# Patient Record
Sex: Male | Born: 1950
Health system: Southern US, Community
[De-identification: ages and names within clinical notes are randomized; demographics above are authoritative.]

## PROBLEM LIST (undated history)

## (undated) DIAGNOSIS — E785 Hyperlipidemia, unspecified: Secondary | ICD-10-CM

## (undated) DIAGNOSIS — R42 Dizziness and giddiness: Secondary | ICD-10-CM

## (undated) DIAGNOSIS — I10 Essential (primary) hypertension: Secondary | ICD-10-CM

## (undated) DIAGNOSIS — K219 Gastro-esophageal reflux disease without esophagitis: Secondary | ICD-10-CM

## (undated) DIAGNOSIS — H8110 Benign paroxysmal vertigo, unspecified ear: Secondary | ICD-10-CM

## (undated) DIAGNOSIS — G454 Transient global amnesia: Secondary | ICD-10-CM

## (undated) HISTORY — DX: Benign paroxysmal vertigo, unspecified ear: H81.10

## (undated) HISTORY — DX: Gastro-esophageal reflux disease without esophagitis: K21.9

## (undated) HISTORY — PX: KNEE SURGERY: SHX244

## (undated) HISTORY — DX: Hyperlipidemia, unspecified: E78.5

## (undated) HISTORY — PX: OTHER SURGICAL HISTORY: SHX169

## (undated) HISTORY — DX: Transient global amnesia: G45.4

---

## 1998-11-25 ENCOUNTER — Ambulatory Visit (HOSPITAL_COMMUNITY): Admission: RE | Admit: 1998-11-25 | Discharge: 1998-11-25 | Payer: Self-pay | Admitting: Gastroenterology

## 2001-08-01 ENCOUNTER — Encounter (INDEPENDENT_AMBULATORY_CARE_PROVIDER_SITE_OTHER): Payer: Self-pay | Admitting: Specialist

## 2001-08-01 ENCOUNTER — Ambulatory Visit (HOSPITAL_COMMUNITY): Admission: RE | Admit: 2001-08-01 | Discharge: 2001-08-01 | Payer: Self-pay | Admitting: Gastroenterology

## 2006-03-13 ENCOUNTER — Encounter: Admission: RE | Admit: 2006-03-13 | Discharge: 2006-03-13 | Payer: Self-pay | Admitting: Family Medicine

## 2007-02-27 ENCOUNTER — Encounter: Admission: RE | Admit: 2007-02-27 | Discharge: 2007-02-27 | Payer: Self-pay | Admitting: Family Medicine

## 2007-03-17 ENCOUNTER — Encounter: Admission: RE | Admit: 2007-03-17 | Discharge: 2007-03-17 | Payer: Self-pay | Admitting: Family Medicine

## 2007-03-18 ENCOUNTER — Emergency Department (HOSPITAL_COMMUNITY): Admission: EM | Admit: 2007-03-18 | Discharge: 2007-03-18 | Payer: Self-pay | Admitting: Emergency Medicine

## 2007-04-15 ENCOUNTER — Encounter: Admission: RE | Admit: 2007-04-15 | Discharge: 2007-05-07 | Payer: Self-pay | Admitting: Family Medicine

## 2007-05-20 ENCOUNTER — Encounter: Admission: RE | Admit: 2007-05-20 | Discharge: 2007-05-29 | Payer: Self-pay | Admitting: Family Medicine

## 2010-04-25 ENCOUNTER — Encounter
Admission: RE | Admit: 2010-04-25 | Discharge: 2010-04-25 | Payer: Self-pay | Source: Home / Self Care | Attending: Endocrinology | Admitting: Endocrinology

## 2010-09-22 NOTE — Procedures (Signed)
[place]. [facility]  Patient:    Daniel Murphy, Daniel Murphy Visit Number: [id number] MRN: [id number]          Service Type: END Location: ENDO Attending Physician:  Nelda Marseille Dictated by:   Petra Kuba, M.D. Proc. Date: 08/01/01 Admit Date:  08/01/2001   CC:         Duffy Rhody C. Andrey Campanile, M.D.   Procedure Report  PROCEDURE:  Colonoscopy, biopsy, and polypectomy.  INDICATIONS FOR PROCEDURE:  Bright red blood per rectum, due for colonic screening.  CONSENT:  Consent was signed after risks, benefits, methods, and options were thoroughly discussed in the office.  MEDICATIONS USED: 1. Demerol 90 mg. 2. Versed 9 mg.  DESCRIPTION OF PROCEDURE:  Rectal inspection is pertinent for small external hemorrhoids.  Digital examination was negative.  Pediatric video adjustable colonoscope was inserted and fairly easily advanced around the colon to the cecum.  This did require rolling him on his back and some abdominal pressure. The cecum was identified by the appendiceal orifice and the ileocecal valve. On insertion, there was some tiny erosions periodically throughout the colon, but no other lesions seen.  The scope was inserted a short ways into the terminal ileum which was normal.  Scope was slowly withdrawn.  The prep was adequate.  There was some liquid stool that required washing and suctioning, but on slow withdrawal through the colon, adequate visualization was obtained. There were tiny erosions throughout the colon.  They looked more prep induced than any other etiology, and multiple scattered biopsies were obtained. However, on slow withdrawal through the colon, other than these abnormalities, no other diverticuli, polyps, masses, or other abnormalities were seen as we slowly withdrew back to the rectum.  Once back in the rectum, the scope was retroflexed, pertinent for some small internal hemorrhoids, and a small pedunculated inflammatory looking polyp  on retroflexion.  This seemed to be just outside the anal rectal junction, and just outside the hemorrhoidal line. We went ahead and probed it with the biopsy forceps, and it did confirm its pedunculated nature.  We evaluated both on stray and retroflex visualization, and thought it would be easier to snare on retroflexion which we did. Electrocautery applied, and the polyp was removed, suctioned through the scope, and collected in the trap.  There was a nice white coagulum seen without any obvious residual polyp.  This did have a slight inflammatory appearance to it.  The scope was straightened, readvanced a short ways up the sigmoid.  Air was suctioned, scope removed.  The patient tolerated the procedure well.  There was no obvious immediate complication.  ENDOSCOPIC DIAGNOSES: 1. Internal and external hemorrhoids. 2. Rectal distal polyp on retroflexion, status post snare, possibly    inflammatory. 3. Tiny erosions throughout the colon scattered, probably prep induced status    post biopsy. 4. Otherwise within normal limits to the terminal ileum.  PLAN: 1. Await pathology to determine future colonic screening. 2. Follow up p.r.n. or in 2 to 3 months to recheck symptoms, probably guaiac    to make sure no further workup plans are needed. Dictated by:   Petra Kuba, M.D. Attending Physician:  Nelda Marseille DD:  08/01/01 TD:  08/02/01 Job: [id number]

## 2011-01-31 ENCOUNTER — Other Ambulatory Visit: Payer: Self-pay | Admitting: Dermatology

## 2012-02-27 ENCOUNTER — Other Ambulatory Visit: Payer: Self-pay

## 2014-05-12 ENCOUNTER — Other Ambulatory Visit: Payer: Self-pay | Admitting: Orthopedic Surgery

## 2014-08-30 ENCOUNTER — Emergency Department (HOSPITAL_BASED_OUTPATIENT_CLINIC_OR_DEPARTMENT_OTHER)
Admission: EM | Admit: 2014-08-30 | Discharge: 2014-08-30 | Disposition: A | Discharge status: Home / Self Care | Payer: 59 | Attending: Emergency Medicine | Admitting: Emergency Medicine

## 2014-08-30 ENCOUNTER — Encounter (HOSPITAL_BASED_OUTPATIENT_CLINIC_OR_DEPARTMENT_OTHER): Payer: Self-pay | Admitting: Emergency Medicine

## 2014-08-30 DIAGNOSIS — S61012A Laceration without foreign body of left thumb without damage to nail, initial encounter: Secondary | ICD-10-CM | POA: Diagnosis present

## 2014-08-30 DIAGNOSIS — Y9289 Other specified places as the place of occurrence of the external cause: Secondary | ICD-10-CM | POA: Diagnosis not present

## 2014-08-30 DIAGNOSIS — Z87891 Personal history of nicotine dependence: Secondary | ICD-10-CM | POA: Diagnosis not present

## 2014-08-30 DIAGNOSIS — I10 Essential (primary) hypertension: Secondary | ICD-10-CM | POA: Diagnosis not present

## 2014-08-30 DIAGNOSIS — Z79899 Other long term (current) drug therapy: Secondary | ICD-10-CM | POA: Diagnosis not present

## 2014-08-30 DIAGNOSIS — Y998 Other external cause status: Secondary | ICD-10-CM | POA: Insufficient documentation

## 2014-08-30 DIAGNOSIS — Y288XXA Contact with other sharp object, undetermined intent, initial encounter: Secondary | ICD-10-CM | POA: Diagnosis not present

## 2014-08-30 DIAGNOSIS — Y9389 Activity, other specified: Secondary | ICD-10-CM | POA: Diagnosis not present

## 2014-08-30 HISTORY — DX: Dizziness and giddiness: R42

## 2014-08-30 HISTORY — DX: Essential (primary) hypertension: I10

## 2014-08-30 MED ORDER — TRAMADOL HCL 50 MG PO TABS: 50.0000 mg | ORAL_TABLET | Freq: Four times a day (QID) | ORAL | Status: DC | PRN

## 2014-08-30 MED ORDER — LIDOCAINE HCL (PF) 1 % IJ SOLN
5.0000 mL | Freq: Once | INTRAMUSCULAR | Status: DC
  Filled 2014-08-30: qty 5

## 2014-08-30 NOTE — ED Provider Notes (Signed)
CSN: [id number]     Arrival date & time 08/30/14  1636 History   First MD Initiated Contact with Patient 08/30/14 1757     Chief Complaint  Patient presents with  . Extremity Laceration     (Consider location/radiation/quality/duration/timing/severity/associated sxs/prior Treatment) HPI Pt is a 64yo male presenting to ED with laceration to Left thumb that occurred around 4PM today while pt was using a razor blade knife to cut carpet.  Pain is mild stinging and burning, with associated bleeding improved with pressure to laceration site. No other injuries. Denies numbness of thumb. No hx of diabetes. Reports being UTD on his Tetanus.   Past Medical History  Diagnosis Date  . Hypertension   . Vertigo    No past surgical history on file. History reviewed. No pertinent family history. History  Substance Use Topics  . Smoking status: Former Research scientist (life sciences)  . Smokeless tobacco: Not on file  . Alcohol Use: No    Review of Systems  Musculoskeletal: Negative for myalgias, joint swelling and arthralgias.  Skin: Positive for wound. Negative for color change.       Left thumb  Neurological: Negative for weakness and numbness.  All other systems reviewed and are negative.     Allergies  Review of patient's allergies indicates no known allergies.  Home Medications   Prior to Admission medications   Medication Sig Start Date End Date Taking? Authorizing Provider  atorvastatin (LIPITOR) 20 MG tablet Take 20 mg by mouth daily.   Yes Historical Provider, MD  cholecalciferol (VITAMIN D) 1000 UNITS tablet Take 1,000 Units by mouth daily.   Yes Historical Provider, MD  lisinopril (PRINIVIL,ZESTRIL) 20 MG tablet Take 25 mg by mouth daily.   Yes Historical Provider, MD  RABEprazole (ACIPHEX) 20 MG tablet Take 20 mg by mouth daily.   Yes Historical Provider, MD  traMADol (ULTRAM) 50 MG tablet Take 1 tablet (50 mg total) by mouth every 6 (six) hours as needed. 08/30/14   Noland Fordyce, PA-C   BP  131/84 mmHg  Pulse 73  Temp(Src) 98.2 F (36.8 C) (Oral)  Resp 18  Ht 6' (1.829 m)  Wt 208 lb (94.348 kg)  BMI 28.20 kg/m2  SpO2 96% Physical Exam  Constitutional: He is oriented to person, place, and time. He appears well-developed and well-nourished.  HENT:  Head: Normocephalic and atraumatic.  Eyes: EOM are normal.  Neck: Normal range of motion.  Cardiovascular: Normal rate.   Pulmonary/Chest: Effort normal.  Musculoskeletal: Normal range of motion. He exhibits tenderness. He exhibits no edema.  Left thumb: laceration on dorsal ulnar aspect. FROM, mild tenderness near laceration (see skin exam)  Neurological: He is alert and oriented to person, place, and time.  Left thumb: sensation in tact  Skin: Skin is warm and dry.  Left thumb, dorsal ulnar aspect: 3cm 'U' shaped laceration with skin flap.  No nailbed involvement. active bleeding controlled with light pressure.  No foreign bodies.   Psychiatric: He has a normal mood and affect. His behavior is normal.  Nursing note and vitals reviewed.   ED Course  Procedures   LACERATION REPAIR Performed by: Noland Fordyce A. Authorized by: Gwenyth Bender Consent: Verbal consent obtained. Risks and benefits: risks, benefits and alternatives were discussed Consent given by: patient Patient identity confirmed: provided demographic data Prepped and Draped in normal sterile fashion Wound explored  Laceration Location: Left thumb  Laceration Length: 3 cm  No Foreign Bodies seen or palpated  Anesthesia: local infiltration  Local anesthetic:  lidocaine 1% without epinephrine  Anesthetic total: 1 ml  Irrigation method: syringe Amount of cleaning: standard  Skin closure: close approximation, 4-0 prolene  Number of sutures: 3  Technique: interrupted   Patient tolerance: Patient tolerated the procedure well with no immediate complications.   Labs Review Labs Reviewed - No data to display  Imaging Review No results  found.   EKG Interpretation None      MDM   Final diagnoses:  Thumb laceration, left, initial encounter    Laceration to Left thumb w/o nailbed involvement.  Thumb is neurovascularly in tact.  No foreign bodies on exam.  Pt UTD on tetanus.  Laceration repaired with sutures in ED w/o immediate complication. Home care instructions provided. Advised to f/u in 10-14 days for suture removal. Pt verbalized understanding and agreement with tx plan.     Noland Fordyce, PA-C 08/31/14 6834  Veryl Speak, MD 08/31/14 [phone]

## 2014-08-30 NOTE — ED Notes (Signed)
Lac to left thumb with a razor blade knife

## 2015-05-11 ENCOUNTER — Encounter (HOSPITAL_COMMUNITY): Payer: 59

## 2015-05-12 ENCOUNTER — Other Ambulatory Visit: Payer: Self-pay | Admitting: Endocrinology

## 2015-05-12 ENCOUNTER — Ambulatory Visit (HOSPITAL_COMMUNITY)
Admission: RE | Admit: 2015-05-12 | Discharge: 2015-05-12 | Disposition: A | Discharge status: Home / Self Care | Payer: 59 | Source: Ambulatory Visit | Attending: Vascular Surgery | Admitting: Vascular Surgery

## 2015-05-12 ENCOUNTER — Other Ambulatory Visit: Payer: Self-pay | Admitting: Nurse Practitioner

## 2015-05-12 ENCOUNTER — Other Ambulatory Visit (HOSPITAL_COMMUNITY): Payer: Self-pay | Admitting: Endocrinology

## 2015-05-12 DIAGNOSIS — G454 Transient global amnesia: Secondary | ICD-10-CM

## 2015-05-15 ENCOUNTER — Ambulatory Visit
Admission: RE | Admit: 2015-05-15 | Discharge: 2015-05-15 | Disposition: A | Discharge status: Home / Self Care | Payer: 59 | Source: Ambulatory Visit | Attending: Nurse Practitioner | Admitting: Nurse Practitioner

## 2015-05-15 DIAGNOSIS — G454 Transient global amnesia: Secondary | ICD-10-CM

## 2015-05-31 ENCOUNTER — Encounter: Payer: Self-pay | Admitting: Neurology

## 2015-05-31 ENCOUNTER — Ambulatory Visit (INDEPENDENT_AMBULATORY_CARE_PROVIDER_SITE_OTHER): Payer: 59 | Admitting: Neurology

## 2015-05-31 VITALS — BP 159/91 | HR 75 | Ht 72.0 in | Wt 211.0 lb

## 2015-05-31 DIAGNOSIS — R404 Transient alteration of awareness: Secondary | ICD-10-CM | POA: Diagnosis not present

## 2015-05-31 NOTE — Progress Notes (Signed)
Reason for visit: Transient amnesia  Referring physician: Dr. Latrelle Dodrill is a 65 y.o. male  History of present illness:  [name] is a 65 year old right-handed white male with a history of hypertension who experienced an episode of transient amnesia on 04/28/2015. The patient indicates that the episode was quite brief, less than 10 or 15 minutes. The patient recalls no other associated symptoms such as headache, numbness or weakness, gait instability, or syncope. The patient appeared to be alert during the event, he was fully ambulatory, but he appeared to be confused, he did not know where he was at the time. The patient had no headache before, during, or after the event. He does have a history of vertigo over the last 40 years, but there was no change in these symptoms. The patient denies a history of migraine headache. He returned to his usual baseline afterwards, he has felt well since that time without recurrence. He has never had any previous similar events. He has seen Dr. Forde Dandy, and a carotid Doppler study and MRI evaluation of the brain has been done. The carotid Doppler study shows less than 40% stenosis of the internal carotid arteries bilaterally, left common carotid artery has less than 50% stenosis. MRI of the brain was done, this shows no acute changes, minimal white matter changes are noted. The patient is sent to this office for an evaluation.  Past Medical History  Diagnosis Date  . Hypertension   . Vertigo   . Hyperlipemia     Past Surgical History  Procedure Laterality Date  . Knee surgery    . Skin cancer resection Left     ear    Family History  Problem Relation Age of Onset  . COPD Mother   . Cancer Father   . Diabetes Sister   . Diabetes Brother     Social history:  reports that he quit smoking about 27 years ago. He has never used smokeless tobacco. He reports that he drinks about 0.6 oz of alcohol per week. He reports that he does not use  illicit drugs.  Medications:  Prior to Admission medications   Medication Sig Start Date End Date Taking? Authorizing Provider  atorvastatin (LIPITOR) 20 MG tablet Take 20 mg by mouth daily.   Yes Historical Provider, MD  cholecalciferol (VITAMIN D) 1000 UNITS tablet Take 1,000 Units by mouth daily.   Yes Historical Provider, MD  hydrochlorothiazide (HYDRODIURIL) 25 MG tablet Take 12.5 mg by mouth daily. 03/19/15  Yes Historical Provider, MD  lisinopril (PRINIVIL,ZESTRIL) 20 MG tablet Take 25 mg by mouth daily.   Yes Historical Provider, MD  RABEprazole (ACIPHEX) 20 MG tablet Take 20 mg by mouth daily.   Yes Historical Provider, MD  traMADol (ULTRAM) 50 MG tablet Take 1 tablet (50 mg total) by mouth every 6 (six) hours as needed. 08/30/14  Yes Noland Fordyce, PA-C     No Known Allergies  ROS:  Out of a complete 14 system review of symptoms, the patient complains only of the following symptoms, and all other reviewed systems are negative.  Hearing loss, ringing in the ears Memory loss Dizziness  Blood pressure 159/91, pulse 75, height 6' (1.829 m), weight 211 lb (95.709 kg).  Physical Exam  General: The patient is alert and cooperative at the time of the examination.  Eyes: Pupils are equal, round, and reactive to light. Discs are flat bilaterally.  Neck: The neck is supple, no carotid bruits are noted.  Respiratory: The respiratory examination is clear.  Cardiovascular: The cardiovascular examination reveals a regular rate and rhythm, no obvious murmurs or rubs are noted.  Skin: Extremities are without significant edema.  Neurologic Exam  Mental status: The patient is alert and oriented x 3 at the time of the examination. The patient has apparent normal recent and remote memory, with an apparently normal attention span and concentration ability.  Cranial nerves: Facial symmetry is present. There is good sensation of the face to pinprick and soft touch bilaterally. The  strength of the facial muscles and the muscles to head turning and shoulder shrug are normal bilaterally. Speech is well enunciated, no aphasia or dysarthria is noted. Extraocular movements are full. Visual fields are full. The tongue is midline, and the patient has symmetric elevation of the soft palate. No obvious hearing deficits are noted.  Motor: The motor testing reveals 5 over 5 strength of all 4 extremities. Good symmetric motor tone is noted throughout.  Sensory: Sensory testing is intact to pinprick, soft touch, vibration sensation, and position sense on all 4 extremities. No evidence of extinction is noted.  Coordination: Cerebellar testing reveals good finger-nose-finger and heel-to-shin bilaterally.  Gait and station: Gait is normal. Tandem gait is normal. Romberg is negative. No drift is seen.  Reflexes: Deep tendon reflexes are symmetric and normal bilaterally. Toes are downgoing bilaterally.    MRI brain 05/15/15:  IMPRESSION: 1. No acute intracranial abnormality or mass. 2. Minimal chronic small vessel ischemic disease.  * MRI scan images were reviewed online. I agree with the written report.     Assessment/Plan:  1. Transient loss of awareness  The episode was very brief in duration, but appear to represent transient amnesia. Transient global amnesia should be considered, but this episode was more brief than what is normally noted with this entity. The patient will undergo an EEG study, and if this is unremarkable, no further workup will be undertaken. The patient is to go on a low-dose aspirin, 81 mg daily.  [name] 05/31/2015 6:35 PM  [facility] [address] [place], [place] [address]  Phone [phone] Fax [phone]

## 2015-06-20 ENCOUNTER — Ambulatory Visit (INDEPENDENT_AMBULATORY_CARE_PROVIDER_SITE_OTHER): Payer: 59

## 2015-06-20 ENCOUNTER — Telehealth: Payer: Self-pay | Admitting: Neurology

## 2015-06-20 DIAGNOSIS — R410 Disorientation, unspecified: Secondary | ICD-10-CM | POA: Diagnosis not present

## 2015-06-20 DIAGNOSIS — R404 Transient alteration of awareness: Secondary | ICD-10-CM

## 2015-06-20 NOTE — Telephone Encounter (Signed)
I called the patient. EEG study was unremarkable, the patient is to remain on low-dose aspirin, contact me if he has another episode of transient  Amnesia.

## 2015-06-20 NOTE — Procedures (Signed)
    History:  Daniel Murphy is a 65 year old patient with a history of a transient event of amnesia that occurred on 04/28/2015. The patient has no recollection of events lasting about 15 or 20 minutes with full resolution. He is being evaluated for possible seizure-type events.  This is a routine EEG. No skull defects are noted. Medications include Lipitor, vitamin D supplementation, hydrochlorothiazide, lisinopril, AcipHex, and Ultram.   EEG classification: Normal awake  Description of the recording: The background rhythms of this recording consists of a fairly well modulated medium amplitude alpha rhythm of 8 Hz that is reactive to eye opening and closure. As the record progresses, the patient appears to remain in the waking state throughout the recording. Photic stimulation was performed, resulting in a bilateral and symmetric photic driving response. Hyperventilation was also performed, resulting in a minimal buildup of the background rhythm activities without significant slowing seen. At no time during the recording does there appear to be evidence of spike or spike wave discharges or evidence of focal slowing. EKG monitor shows no evidence of cardiac rhythm abnormalities with a heart rate of 60.  Impression: This is a normal EEG recording in the waking state. No evidence of ictal or interictal discharges are seen.

## 2017-12-20 ENCOUNTER — Encounter (HOSPITAL_COMMUNITY): Payer: Self-pay | Admitting: Emergency Medicine

## 2017-12-20 ENCOUNTER — Other Ambulatory Visit: Payer: Self-pay

## 2017-12-20 ENCOUNTER — Emergency Department (HOSPITAL_COMMUNITY)
Admission: EM | Admit: 2017-12-20 | Discharge: 2017-12-21 | Disposition: A | Discharge status: Home / Self Care | Payer: 59 | Attending: Emergency Medicine | Admitting: Emergency Medicine

## 2017-12-20 ENCOUNTER — Emergency Department (HOSPITAL_COMMUNITY): Payer: 59

## 2017-12-20 DIAGNOSIS — Z79899 Other long term (current) drug therapy: Secondary | ICD-10-CM | POA: Diagnosis not present

## 2017-12-20 DIAGNOSIS — I10 Essential (primary) hypertension: Secondary | ICD-10-CM | POA: Insufficient documentation

## 2017-12-20 DIAGNOSIS — G454 Transient global amnesia: Secondary | ICD-10-CM | POA: Diagnosis not present

## 2017-12-20 DIAGNOSIS — Z87891 Personal history of nicotine dependence: Secondary | ICD-10-CM | POA: Diagnosis not present

## 2017-12-20 DIAGNOSIS — R4182 Altered mental status, unspecified: Secondary | ICD-10-CM | POA: Diagnosis present

## 2017-12-20 LAB — COMPREHENSIVE METABOLIC PANEL
ALBUMIN: 4.3 g/dL (ref 3.5–5.0)
ALT: 37 U/L (ref 0–44)
AST: 30 U/L (ref 15–41)
Alkaline Phosphatase: 57 U/L (ref 38–126)
Anion gap: 11 (ref 5–15)
BILIRUBIN TOTAL: 0.9 mg/dL (ref 0.3–1.2)
BUN: 16 mg/dL (ref 8–23)
CHLORIDE: 105 mmol/L (ref 98–111)
CO2: 21 mmol/L — ABNORMAL LOW (ref 22–32)
CREATININE: 1.19 mg/dL (ref 0.61–1.24)
Calcium: 9.6 mg/dL (ref 8.9–10.3)
GFR calc Af Amer: >60 mL/min (ref >60)
GFR calc non Af Amer: >60 mL/min (ref >60)
GLUCOSE: 129 mg/dL — AB (ref 70–99)
POTASSIUM: 4.1 mmol/L (ref 3.5–5.1)
Sodium: 137 mmol/L (ref 135–145)
TOTAL PROTEIN: 8 g/dL (ref 6.5–8.1)

## 2017-12-20 LAB — CBC
HEMATOCRIT: 44.7 % (ref 39.0–52.0)
HEMOGLOBIN: 14.9 g/dL (ref 13.0–17.0)
MCH: 31.9 pg (ref 26.0–34.0)
MCHC: 33.3 g/dL (ref 30.0–36.0)
MCV: 95.7 fL (ref 78.0–100.0)
Platelets: 247 10*3/uL (ref 150–400)
RBC: 4.67 MIL/uL (ref 4.22–5.81)
RDW: 12.4 % (ref 11.5–15.5)
WBC: 12.9 10*3/uL — AB (ref 4.0–10.5)

## 2017-12-20 LAB — DIFFERENTIAL
ABS IMMATURE GRANULOCYTES: 0.1 10*3/uL (ref 0.0–0.1)
Basophils Absolute: 0.1 10*3/uL (ref 0.0–0.1)
Basophils Relative: 1 %
Eosinophils Absolute: 0 10*3/uL (ref 0.0–0.7)
Eosinophils Relative: 0 %
IMMATURE GRANULOCYTES: 1 %
LYMPHS PCT: 14 %
Lymphs Abs: 1.7 10*3/uL (ref 0.7–4.0)
Monocytes Absolute: 0.8 10*3/uL (ref 0.1–1.0)
Monocytes Relative: 6 %
NEUTROS ABS: 10.2 10*3/uL — AB (ref 1.7–7.7)
Neutrophils Relative %: 78 %

## 2017-12-20 LAB — CBG MONITORING, ED: Glucose-Capillary: 130 mg/dL — ABNORMAL HIGH (ref 70–99)

## 2017-12-20 LAB — APTT: aPTT: 29 seconds (ref 24–36)

## 2017-12-20 LAB — PROTIME-INR
INR: 1.04
PROTHROMBIN TIME: 13.5 s (ref 11.4–15.2)

## 2017-12-20 LAB — I-STAT TROPONIN, ED: TROPONIN I, POC: 0 ng/mL (ref 0.00–0.08)

## 2017-12-20 NOTE — ED Notes (Signed)
Wife states she took BP at home and it was 181/86.  Pt doesn't remember her taking BP.  Pt confused about her taking BP and states he didn't know she knew how to take it.

## 2017-12-20 NOTE — ED Triage Notes (Signed)
Wife reports pt has been confused ever since she got home from work and is having problems remembering things.  LKW this morning before she went to work at [employer] does not know month.  Didn't know day of week prior to arrival but remembers wife telling him it is Friday.  Pt knows age but very slow to answer.  Pt self-employed and wife unsure if he worked today because she did not talk to him during the day.  No arm drift.  Speech clear.  Denies weakness/numbness.

## 2017-12-21 ENCOUNTER — Emergency Department (HOSPITAL_COMMUNITY): Payer: 59

## 2017-12-21 LAB — RAPID URINE DRUG SCREEN, HOSP PERFORMED
AMPHETAMINES: NOT DETECTED
Barbiturates: NOT DETECTED
Benzodiazepines: NOT DETECTED
Cocaine: NOT DETECTED
OPIATES: NOT DETECTED
TETRAHYDROCANNABINOL: NOT DETECTED

## 2017-12-21 LAB — ETHANOL: Alcohol, Ethyl (B): <10 mg/dL (ref <10)

## 2017-12-21 NOTE — ED Provider Notes (Signed)
[facility] EMERGENCY DEPARTMENT Provider Note   CSN: [id number] Arrival date & time: 12/20/17  2135     History   Chief Complaint Chief Complaint  Patient presents with  . Altered Mental Status    HPI Daniel Murphy is a 67 y.o. male.  HPI 67 year old male who is brought to the emergency department after being found confused by his spouse.  She reports that he was normal this morning when she left the house around 830 or 9.  Patient went to work and it is normal activities.  He states he came home around 2:00 to watch golf and does not remember much of the events after this.  He does not remember speaking to his wife.  His wife reported that on the phone he stated he did not know how to drive to the restaurant and when she came home she found him walking around the backyard with a normal gait.  She reports that he was not sure why he was outside.  She reports no change in his speech or slurred speech.  Patient denies alcohol use.  He has a history of transient global amnesia 2 years ago and states this event feels very similar to that.  No history of seizures.  No tongue biting.  No urinary incontinence today.  No new medications today.  No fevers or chills.  No headache or neck pain.  Several years ago when he had similar symptoms he was worked up as an outpatient and was seen by neurology and felt to have transient global amnesia.  Past Medical History:  Diagnosis Date  . Hyperlipemia   . Hypertension   . Vertigo     Patient Active Problem List   Diagnosis Date Noted  . Awareness alteration, transient 05/31/2015    Past Surgical History:  Procedure Laterality Date  . KNEE SURGERY    . skin cancer resection Left    ear        Home Medications    Prior to Admission medications   Medication Sig Start Date End Date Taking? Authorizing Provider  atorvastatin (LIPITOR) 20 MG tablet Take 20 mg by mouth daily.    [provider]  cholecalciferol  (VITAMIN D) 1000 UNITS tablet Take 1,000 Units by mouth daily.    [provider]  hydrochlorothiazide (HYDRODIURIL) 25 MG tablet Take 12.5 mg by mouth daily. 03/19/15   [provider]  lisinopril (PRINIVIL,ZESTRIL) 20 MG tablet Take 25 mg by mouth daily.    [provider]  RABEprazole (ACIPHEX) 20 MG tablet Take 20 mg by mouth daily.    [provider]  traMADol (ULTRAM) 50 MG tablet Take 1 tablet (50 mg total) by mouth every 6 (six) hours as needed. 08/30/14   Noe Gens, PA-C    Family History Family History  Problem Relation Age of Onset  . COPD Mother   . Cancer Father   . Diabetes Sister   . Diabetes Brother     Social History Social History   Tobacco Use  . Smoking status: Former Smoker    Last attempt to quit: 05/07/1988    Years since quitting: 29.6  . Smokeless tobacco: Never Used  Substance Use Topics  . Alcohol use: Yes    Alcohol/week: 1.0 standard drinks    Types: 1 Standard drinks or equivalent per week  . Drug use: No     Allergies   Patient has no known allergies.   Review of Systems Review  of Systems  All other systems reviewed and are negative.    Physical Exam Updated Vital Signs BP 124/62   Pulse (!) 58   Temp 98.7 F (37.1 C) (Oral)   Resp 17   Ht 6' (1.829 m)   Wt 99.8 kg   SpO2 97%   BMI 29.84 kg/m   Physical Exam  Constitutional: He is oriented to person, place, and time. He appears well-developed and well-nourished.  HENT:  Head: Normocephalic and atraumatic.  Eyes: Pupils are equal, round, and reactive to light. EOM are normal.  Neck: Normal range of motion.  Cardiovascular: Normal rate, regular rhythm and intact distal pulses.  Pulmonary/Chest: Effort normal and breath sounds normal. No respiratory distress.  Abdominal: Soft. He exhibits no distension. There is no tenderness.  Musculoskeletal: Normal range of motion.  Neurological: He is alert and oriented to person, place, and time.    5/5 strength in major muscle groups of  bilateral upper and lower extremities. Speech normal. No facial asymetry.   Skin: Skin is warm and dry.  Nursing note and vitals reviewed.    ED Treatments / Results  Labs (all labs ordered are listed, but only abnormal results are displayed) Labs Reviewed  CBC - Abnormal; Notable for the following components:      Result Value   WBC 12.9 (*)    All other components within normal limits  DIFFERENTIAL - Abnormal; Notable for the following components:   Neutro Abs 10.2 (*)    All other components within normal limits  COMPREHENSIVE METABOLIC PANEL - Abnormal; Notable for the following components:   CO2 21 (*)    Glucose, Bld 129 (*)    All other components within normal limits  CBG MONITORING, ED - Abnormal; Notable for the following components:   Glucose-Capillary 130 (*)    All other components within normal limits  PROTIME-INR  APTT  ETHANOL  RAPID URINE DRUG SCREEN, HOSP PERFORMED  I-STAT TROPONIN, ED    EKG EKG Interpretation  Date/Time:  Friday December 20 2017 21:49:47 EDT Ventricular Rate:  88 PR Interval:  172 QRS Duration: 104 QT Interval:  366 QTC Calculation: 442 R Axis:   -47 Text Interpretation:  Normal sinus rhythm Left axis deviation Pulmonary disease pattern Abnormal ECG No old tracing to compare Confirmed by Jola Schmidt [phone]) on 12/21/2017 3:06:58 AM   Radiology Ct Head Wo Contrast  Result Date: 12/20/2017 CLINICAL DATA:  67 y/o  M; confusion. EXAM: CT HEAD WITHOUT CONTRAST TECHNIQUE: Contiguous axial images were obtained from the base of the skull through the vertex without intravenous contrast. COMPARISON:  05/15/2015 MRI head. FINDINGS: Brain: No evidence of acute infarction, hemorrhage, hydrocephalus, extra-axial collection or mass lesion/mass effect. Vascular: Mild calcific atherosclerosis of the carotid siphons and vertebral arteries. No hyperdense vessel identified. Skull: Normal. Negative for fracture or  focal lesion. Sinuses/Orbits: No acute finding. Other: None. IMPRESSION: No acute intracranial abnormality. Stable given differences in technique. Electronically Signed   By: Kristine Garbe M.D.   On: 12/20/2017 22:33   Mr Brain Wo Contrast  Result Date: 12/21/2017 CLINICAL DATA:  Initial evaluation for transient global amnesia. EXAM: MRI HEAD WITHOUT CONTRAST TECHNIQUE: Multiplanar, multiecho pulse sequences of the brain and surrounding structures were obtained without intravenous contrast. COMPARISON:  Prior CT from 12/20/2017 FINDINGS: Brain: Cerebral volume within normal limits for patient age. Few scattered T2/FLAIR hyperintense foci noted within the periventricular white matter, nonspecific, but felt to be within normal limits for age. No abnormal foci of  restricted diffusion to suggest acute or subacute ischemia. Gray-white matter differentiation well maintained. No encephalomalacia to suggest chronic infarction. No foci of susceptibility artifact to suggest acute or chronic intracranial hemorrhage. Mass lesion, midline shift or mass effect. No hydrocephalus. No extra-axial fluid collection. Major dural sinuses are grossly patent. Pituitary gland and suprasellar region are normal. Midline structures intact and normal. Vascular: Major intracranial vascular flow voids well maintained and normal in appearance. Skull and upper cervical spine: Craniocervical junction normal. Degenerative spondylolysis noted at C3-4 without significant stenosis. Bone marrow signal intensity normal. No scalp soft tissue abnormality. Sinuses/Orbits: Globes and orbital soft tissues within normal limits. Paranasal sinuses are clear. No mastoid effusion. Inner ear structures normal. Other: None. IMPRESSION: Normal brain MRI for age.  No acute intracranial abnormality. Electronically Signed   By: Jeannine Boga M.D.   On: 12/21/2017 04:16    Procedures Procedures (including critical care time)  Medications  Ordered in ED Medications - No data to display   Initial Impression / Assessment and Plan / ED Course  I have reviewed the triage vital signs and the nursing notes.  Pertinent labs & imaging results that were available during my care of the patient were reviewed by me and considered in my medical decision making (see chart for details).     MRI without acute pathology.  Patient has normal neurologic exam at this time.  Doubt seizure.  This is likely a second event of transient global amnesia.  He will follow-up as an outpatient with neurology.  Stable to be discharged in the emergency department with his spouse at this time.  Final Clinical Impressions(s) / ED Diagnoses   Final diagnoses:  Transient global amnesia    ED Discharge Orders    None       Jola Schmidt, MD 12/21/17 [phone]

## 2018-02-13 ENCOUNTER — Encounter

## 2018-02-13 ENCOUNTER — Ambulatory Visit: Payer: 59 | Admitting: Neurology

## 2018-02-13 ENCOUNTER — Encounter: Payer: Self-pay | Admitting: Neurology

## 2018-02-13 VITALS — BP 155/81 | HR 59 | Ht 72.0 in | Wt 216.0 lb

## 2018-02-13 DIAGNOSIS — G454 Transient global amnesia: Secondary | ICD-10-CM

## 2018-02-13 DIAGNOSIS — R404 Transient alteration of awareness: Secondary | ICD-10-CM | POA: Diagnosis not present

## 2018-02-13 HISTORY — DX: Transient global amnesia: G45.4

## 2018-02-13 NOTE — Progress Notes (Signed)
Reason for visit: Transient global amnesia  Referring physician: [facility]  Daniel Murphy is a 67 y.o. male  History of present illness:  Daniel Murphy is a 67 year old right-handed white male with a history of a brief event of transient amnesia lasting only 15 minutes that occurred in December 2016.  The episode was somewhat atypical for transient global amnesia in that it was quite brief.  A work-up at that time revealed a normal MRI of the brain, the patient had an EEG study that was normal.  He has been on low-dose aspirin since that time.  The patient has done quite well for several years, unfortunately, he had a recurring event on 20 December 2017.  The patient had returned home from work, he remembers coming back home, but after 3 PM that day he has no recollection of any events until around 7 or 8 PM that evening.  The patient indicates that his wife called about going out to dinner to a restaurant and he could not remember how to get there.  The wife became concerned, she returned home and found that the patient was alert, cooperative, fully ambulatory, but appeared to be confused.  The patient got up frequently and washed his face again and again.  He reported no headache or dizziness.  His ability to ambulate was normal.  He recalls no problems with numbness or weakness of the extremities.  He was taken to the emergency room, he actually remembers going to the ER, MRI of the brain was normal, blood work was unremarkable.  The patient is sent to this office for an evaluation.  Past Medical History:  Diagnosis Date  . Hyperlipemia   . Hypertension   . Transient global amnesia 02/13/2018  . Vertigo     Past Surgical History:  Procedure Laterality Date  . KNEE SURGERY    . skin cancer resection Left    ear    Family History  Problem Relation Age of Onset  . COPD Mother   . Cancer Father   . Diabetes Sister   . Diabetes Brother     Social history:  reports that he quit  smoking about 29 years ago. He has never used smokeless tobacco. He reports that he drinks about 1.0 standard drinks of alcohol per week. He reports that he does not use drugs.  Medications:  Prior to Admission medications   Medication Sig Start Date End Date Taking? Authorizing Provider  atorvastatin (LIPITOR) 20 MG tablet Take 20 mg by mouth daily.   Yes [provider]  cholecalciferol (VITAMIN D) 1000 UNITS tablet Take 1,000 Units by mouth daily.   Yes [provider]  hydrochlorothiazide (HYDRODIURIL) 25 MG tablet Take 12.5 mg by mouth daily. 03/19/15  Yes [provider]  lisinopril (PRINIVIL,ZESTRIL) 20 MG tablet Take 25 mg by mouth daily.   Yes [provider]  RABEprazole (ACIPHEX) 20 MG tablet Take 20 mg by mouth daily.   Yes [provider]     No Known Allergies  ROS:  Out of a complete 14 system review of symptoms, the patient complains only of the following symptoms, and all other reviewed systems are negative.  Transient amnesia  Blood pressure (!) 155/81, pulse (!) 59, height 6' (1.829 m), weight 216 lb (98 kg).  Physical Exam  General: The patient is alert and cooperative at the time of the examination.  Eyes: Pupils are equal, round, and reactive to light. Discs are flat bilaterally.  Neck: The neck is supple, no carotid bruits are noted.  Respiratory: The respiratory examination is clear.  Cardiovascular: The cardiovascular examination reveals a regular rate and rhythm, no obvious murmurs or rubs are noted.  Skin: Extremities are without significant edema.  Neurologic Exam  Mental status: The patient is alert and oriented x 3 at the time of the examination. The patient has apparent normal recent and remote memory, with an apparently normal attention span and concentration ability.  Cranial nerves: Facial symmetry is present. There is good sensation of the face to pinprick and soft touch bilaterally. The strength  of the facial muscles and the muscles to head turning and shoulder shrug are normal bilaterally. Speech is well enunciated, no aphasia or dysarthria is noted. Extraocular movements are full. Visual fields are full. The tongue is midline, and the patient has symmetric elevation of the soft palate. No obvious hearing deficits are noted.  Motor: The motor testing reveals 5 over 5 strength of all 4 extremities. Good symmetric motor tone is noted throughout.  Sensory: Sensory testing is intact to pinprick, soft touch and vibration sensation on all 4 extremities. No evidence of extinction is noted.  Coordination: Cerebellar testing reveals good finger-nose-finger and heel-to-shin bilaterally.  Gait and station: Gait is normal. Tandem gait is normal. Romberg is negative. No drift is seen.  Reflexes: Deep tendon reflexes are symmetric and normal bilaterally. Toes are downgoing bilaterally.   MRI brain 12/21/17:  IMPRESSION: Normal brain MRI for age.  No acute intracranial abnormality.  * MRI scan images were reviewed online. I agree with the written report.    Assessment/Plan:  1.  Transient global amnesia  The patient appears to have had 2 such events of transient amnesia.  This most recent event is more typical of transient global amnesia, the event lasted 4 or 5 hours.  The patient has no residual deficits, he reported no headache.  The patient will be sent for an EEG study, if this is normal, no further work-up will be done.  If the patient continues to have events as above, he will be placed on anticonvulsant medications empirically.  [name] 02/13/2018 9:35 AM  [facility] [address] [place], [place] [address]  Phone [phone] Fax [phone]

## 2018-02-19 ENCOUNTER — Ambulatory Visit: Payer: 59 | Admitting: Neurology

## 2018-02-19 ENCOUNTER — Telehealth: Payer: Self-pay | Admitting: Neurology

## 2018-02-19 DIAGNOSIS — R4182 Altered mental status, unspecified: Secondary | ICD-10-CM | POA: Diagnosis not present

## 2018-02-19 DIAGNOSIS — G454 Transient global amnesia: Secondary | ICD-10-CM

## 2018-02-19 NOTE — Telephone Encounter (Signed)
I called the patient.  The EEG study is normal.  The patient will contact our office if another event occurs.

## 2018-02-19 NOTE — Procedures (Signed)
    History:  Daniel Murphy is a 67 year old gentleman with an episode on 20 December 2017 of transient global amnesia.  The event lasted 4 or 5 hours with full resolution.  The patient is being evaluated for this event.  This is the second such event that has occurred since 2016.  This is a routine EEG.  No skull defects are noted.  Medications include Lipitor, vitamin D, hydrochlorothiazide, lisinopril, and AcipHex.  EEG classification: Normal awake  Description of the recording: The background rhythms of this recording consists of a fairly well modulated medium amplitude alpha rhythm of 8 Hz that is reactive to eye opening and closure. As the record progresses, the patient appears to remain in the waking state throughout the recording. Photic stimulation was performed, resulting in a bilateral and symmetric photic driving response. Hyperventilation was also performed, resulting in a minimal buildup of the background rhythm activities without significant slowing seen. At no time during the recording does there appear to be evidence of spike or spike wave discharges or evidence of focal slowing. EKG monitor shows no evidence of cardiac rhythm abnormalities with a heart rate of 48.  Impression: This is a normal EEG recording in the waking state. No evidence of ictal or interictal discharges are seen.

## 2018-04-15 DIAGNOSIS — E559 Vitamin D deficiency, unspecified: Secondary | ICD-10-CM | POA: Diagnosis not present

## 2018-04-15 DIAGNOSIS — E7849 Other hyperlipidemia: Secondary | ICD-10-CM | POA: Diagnosis not present

## 2018-04-15 DIAGNOSIS — Z125 Encounter for screening for malignant neoplasm of prostate: Secondary | ICD-10-CM | POA: Diagnosis not present

## 2018-04-15 DIAGNOSIS — I10 Essential (primary) hypertension: Secondary | ICD-10-CM | POA: Diagnosis not present

## 2018-04-22 DIAGNOSIS — E7849 Other hyperlipidemia: Secondary | ICD-10-CM | POA: Diagnosis not present

## 2018-04-22 DIAGNOSIS — I779 Disorder of arteries and arterioles, unspecified: Secondary | ICD-10-CM | POA: Diagnosis not present

## 2018-04-22 DIAGNOSIS — E559 Vitamin D deficiency, unspecified: Secondary | ICD-10-CM | POA: Diagnosis not present

## 2018-04-22 DIAGNOSIS — G454 Transient global amnesia: Secondary | ICD-10-CM | POA: Diagnosis not present

## 2018-04-22 DIAGNOSIS — R7301 Impaired fasting glucose: Secondary | ICD-10-CM | POA: Diagnosis not present

## 2018-04-22 DIAGNOSIS — Z1389 Encounter for screening for other disorder: Secondary | ICD-10-CM | POA: Diagnosis not present

## 2018-04-22 DIAGNOSIS — K219 Gastro-esophageal reflux disease without esophagitis: Secondary | ICD-10-CM | POA: Diagnosis not present

## 2018-04-22 DIAGNOSIS — Z6828 Body mass index (BMI) 28.0-28.9, adult: Secondary | ICD-10-CM | POA: Diagnosis not present

## 2018-04-22 DIAGNOSIS — Z0000 Encounter for general adult medical examination without abnormal findings: Secondary | ICD-10-CM | POA: Diagnosis not present

## 2018-04-22 DIAGNOSIS — I10 Essential (primary) hypertension: Secondary | ICD-10-CM | POA: Diagnosis not present

## 2018-04-22 DIAGNOSIS — N401 Enlarged prostate with lower urinary tract symptoms: Secondary | ICD-10-CM | POA: Diagnosis not present

## 2018-04-25 DIAGNOSIS — Z1212 Encounter for screening for malignant neoplasm of rectum: Secondary | ICD-10-CM | POA: Diagnosis not present

## 2018-05-15 DIAGNOSIS — R0781 Pleurodynia: Secondary | ICD-10-CM | POA: Diagnosis not present

## 2018-05-15 DIAGNOSIS — Z6827 Body mass index (BMI) 27.0-27.9, adult: Secondary | ICD-10-CM | POA: Diagnosis not present

## 2018-10-30 DIAGNOSIS — I779 Disorder of arteries and arterioles, unspecified: Secondary | ICD-10-CM | POA: Diagnosis not present

## 2018-10-30 DIAGNOSIS — I10 Essential (primary) hypertension: Secondary | ICD-10-CM | POA: Diagnosis not present

## 2018-10-30 DIAGNOSIS — E559 Vitamin D deficiency, unspecified: Secondary | ICD-10-CM | POA: Diagnosis not present

## 2018-10-30 DIAGNOSIS — R7301 Impaired fasting glucose: Secondary | ICD-10-CM | POA: Diagnosis not present

## 2018-10-30 DIAGNOSIS — K219 Gastro-esophageal reflux disease without esophagitis: Secondary | ICD-10-CM | POA: Diagnosis not present

## 2018-10-30 DIAGNOSIS — E785 Hyperlipidemia, unspecified: Secondary | ICD-10-CM | POA: Diagnosis not present

## 2018-10-30 DIAGNOSIS — G454 Transient global amnesia: Secondary | ICD-10-CM | POA: Diagnosis not present

## 2018-10-30 DIAGNOSIS — N401 Enlarged prostate with lower urinary tract symptoms: Secondary | ICD-10-CM | POA: Diagnosis not present

## 2018-11-03 DIAGNOSIS — H43813 Vitreous degeneration, bilateral: Secondary | ICD-10-CM | POA: Diagnosis not present

## 2018-11-13 DIAGNOSIS — D225 Melanocytic nevi of trunk: Secondary | ICD-10-CM | POA: Diagnosis not present

## 2018-11-13 DIAGNOSIS — D1801 Hemangioma of skin and subcutaneous tissue: Secondary | ICD-10-CM | POA: Diagnosis not present

## 2018-11-13 DIAGNOSIS — Z85828 Personal history of other malignant neoplasm of skin: Secondary | ICD-10-CM | POA: Diagnosis not present

## 2018-11-13 DIAGNOSIS — L814 Other melanin hyperpigmentation: Secondary | ICD-10-CM | POA: Diagnosis not present

## 2019-01-17 DIAGNOSIS — Z23 Encounter for immunization: Secondary | ICD-10-CM | POA: Diagnosis not present

## 2019-04-16 DIAGNOSIS — Z125 Encounter for screening for malignant neoplasm of prostate: Secondary | ICD-10-CM | POA: Diagnosis not present

## 2019-04-16 DIAGNOSIS — R7301 Impaired fasting glucose: Secondary | ICD-10-CM | POA: Diagnosis not present

## 2019-04-16 DIAGNOSIS — E7849 Other hyperlipidemia: Secondary | ICD-10-CM | POA: Diagnosis not present

## 2019-04-16 DIAGNOSIS — E559 Vitamin D deficiency, unspecified: Secondary | ICD-10-CM | POA: Diagnosis not present

## 2019-05-28 ENCOUNTER — Ambulatory Visit: Payer: 59 | Attending: Internal Medicine

## 2019-05-28 DIAGNOSIS — Z23 Encounter for immunization: Secondary | ICD-10-CM | POA: Insufficient documentation

## 2019-05-28 NOTE — Progress Notes (Signed)
   Covid-19 Vaccination Clinic  Name:  Daniel Murphy    MRN: [id number] DOB: 26-Nov-1950  05/28/2019  [name] was observed post Covid-19 immunization for 15 minutes without incidence. He was provided with Vaccine Information Sheet and instruction to access the V-Safe system.   [name] was instructed to call 911 with any severe reactions post vaccine: Marland Kitchen Difficulty breathing  . Swelling of your face and throat  . A fast heartbeat  . A bad rash all over your body  . Dizziness and weakness    Immunizations Administered    Name Date Dose VIS Date Route   Pfizer COVID-19 Vaccine 05/28/2019 12:48 PM 0.3 mL 04/17/2019 Intramuscular   Manufacturer: [place]   Lot: [id number]   [place]: [id number]

## 2019-06-01 DIAGNOSIS — Z0120 Encounter for dental examination and cleaning without abnormal findings: Secondary | ICD-10-CM | POA: Diagnosis not present

## 2019-06-02 DIAGNOSIS — L02416 Cutaneous abscess of left lower limb: Secondary | ICD-10-CM | POA: Diagnosis not present

## 2019-06-15 ENCOUNTER — Ambulatory Visit: Payer: 59 | Attending: Internal Medicine

## 2019-06-15 DIAGNOSIS — Z23 Encounter for immunization: Secondary | ICD-10-CM | POA: Insufficient documentation

## 2019-06-15 NOTE — Progress Notes (Signed)
   Covid-19 Vaccination Clinic  Name:  MARCELLE KOMOROSKI    MRN: [id number] DOB: May 14, 1950  06/15/2019  [name] was observed post Covid-19 immunization for 15 minutes without incidence. He was provided with Vaccine Information Sheet and instruction to access the V-Safe system.   Mr. Carter Kitten was instructed to call 911 with any severe reactions post vaccine: Marland Kitchen Difficulty breathing  . Swelling of your face and throat  . A fast heartbeat  . A bad rash all over your body  . Dizziness and weakness    Immunizations Administered    Name Date Dose VIS Date Route   Pfizer COVID-19 Vaccine 06/15/2019  8:40 AM 0.3 mL 04/17/2019 Intramuscular   Manufacturer: [place]   Lot: [id number]   [place]: [id number]

## 2019-08-15 ENCOUNTER — Encounter (HOSPITAL_BASED_OUTPATIENT_CLINIC_OR_DEPARTMENT_OTHER): Payer: Self-pay | Admitting: Emergency Medicine

## 2019-08-15 ENCOUNTER — Other Ambulatory Visit: Payer: Self-pay

## 2019-08-15 ENCOUNTER — Emergency Department (HOSPITAL_BASED_OUTPATIENT_CLINIC_OR_DEPARTMENT_OTHER)
Admission: EM | Admit: 2019-08-15 | Discharge: 2019-08-15 | Disposition: A | Discharge status: Home / Self Care | Payer: Medicare Other | Attending: Emergency Medicine | Admitting: Emergency Medicine

## 2019-08-15 ENCOUNTER — Emergency Department (HOSPITAL_BASED_OUTPATIENT_CLINIC_OR_DEPARTMENT_OTHER): Payer: Medicare Other

## 2019-08-15 DIAGNOSIS — I10 Essential (primary) hypertension: Secondary | ICD-10-CM | POA: Insufficient documentation

## 2019-08-15 DIAGNOSIS — Z79899 Other long term (current) drug therapy: Secondary | ICD-10-CM | POA: Insufficient documentation

## 2019-08-15 DIAGNOSIS — Z87891 Personal history of nicotine dependence: Secondary | ICD-10-CM | POA: Diagnosis not present

## 2019-08-15 DIAGNOSIS — G454 Transient global amnesia: Secondary | ICD-10-CM | POA: Diagnosis not present

## 2019-08-15 DIAGNOSIS — R4182 Altered mental status, unspecified: Secondary | ICD-10-CM | POA: Diagnosis present

## 2019-08-15 LAB — COMPREHENSIVE METABOLIC PANEL
ALT: 23 U/L (ref 0–44)
AST: 23 U/L (ref 15–41)
Albumin: 4.4 g/dL (ref 3.5–5.0)
Alkaline Phosphatase: 60 U/L (ref 38–126)
Anion gap: 8 (ref 5–15)
BUN: 18 mg/dL (ref 8–23)
CO2: 21 mmol/L — ABNORMAL LOW (ref 22–32)
Calcium: 9.5 mg/dL (ref 8.9–10.3)
Chloride: 108 mmol/L (ref 98–111)
Creatinine, Ser: 0.96 mg/dL (ref 0.61–1.24)
GFR calc Af Amer: >60 mL/min (ref >60)
GFR calc non Af Amer: >60 mL/min (ref >60)
Glucose, Bld: 129 mg/dL — ABNORMAL HIGH (ref 70–99)
Potassium: 4.1 mmol/L (ref 3.5–5.1)
Sodium: 137 mmol/L (ref 135–145)
Total Bilirubin: 0.9 mg/dL (ref 0.3–1.2)
Total Protein: 7.8 g/dL (ref 6.5–8.1)

## 2019-08-15 LAB — CBC WITH DIFFERENTIAL/PLATELET
Abs Immature Granulocytes: 0.05 10*3/uL (ref 0.00–0.07)
Basophils Absolute: 0.1 10*3/uL (ref 0.0–0.1)
Basophils Relative: 1 %
Eosinophils Absolute: 0.1 10*3/uL (ref 0.0–0.5)
Eosinophils Relative: 1 %
HCT: 44.2 % (ref 39.0–52.0)
Hemoglobin: 14.7 g/dL (ref 13.0–17.0)
Immature Granulocytes: 0 %
Lymphocytes Relative: 13 %
Lymphs Abs: 1.8 10*3/uL (ref 0.7–4.0)
MCH: 31.5 pg (ref 26.0–34.0)
MCHC: 33.3 g/dL (ref 30.0–36.0)
MCV: 94.8 fL (ref 80.0–100.0)
Monocytes Absolute: 0.9 10*3/uL (ref 0.1–1.0)
Monocytes Relative: 7 %
Neutro Abs: 10.3 10*3/uL — ABNORMAL HIGH (ref 1.7–7.7)
Neutrophils Relative %: 78 %
Platelets: 224 10*3/uL (ref 150–400)
RBC: 4.66 MIL/uL (ref 4.22–5.81)
RDW: 12.7 % (ref 11.5–15.5)
WBC: 13.2 10*3/uL — ABNORMAL HIGH (ref 4.0–10.5)
nRBC: 0 % (ref 0.0–0.2)

## 2019-08-15 LAB — CBG MONITORING, ED: Glucose-Capillary: 118 mg/dL — ABNORMAL HIGH (ref 70–99)

## 2019-08-15 LAB — TSH: TSH: 2.648 u[IU]/mL (ref 0.350–4.500)

## 2019-08-15 MED ORDER — LEVETIRACETAM 500 MG PO TABS
500.0000 mg | ORAL_TABLET | Freq: Once | ORAL | Status: AC
  Administered 2019-08-15: 13:00:00 500 mg via ORAL
  Filled 2019-08-15: qty 1

## 2019-08-15 MED ORDER — LEVETIRACETAM 500 MG PO TABS: 500.0000 mg | ORAL_TABLET | Freq: Two times a day (BID) | ORAL | 0 refills | Status: DC

## 2019-08-15 MED ORDER — IOHEXOL 350 MG/ML SOLN
100.0000 mL | Freq: Once | INTRAVENOUS | Status: AC | PRN
  Administered 2019-08-15: 100 mL via INTRAVENOUS

## 2019-08-15 NOTE — ED Triage Notes (Signed)
Per wife, pt was confused for 30 minutes after having sex. She states he was answering questions inappropriately and repeating the same things over and over. Denies pain. This happened at 11:30am.

## 2019-08-15 NOTE — ED Provider Notes (Addendum)
[place] [facility] EMERGENCY DEPARTMENT Provider Note   CSN: [id number] Arrival date & time: 08/15/19  1229     History Chief Complaint  Patient presents with  . Altered Mental Status    Daniel Murphy is a 69 y.o. male   HPI  Patient is a 69 year old gentleman with a history of HLD, HTN, vertigo and transient global amnesia.  He has had 2 episodes in the past TGA and follows with a neurologist Dr. Margette Fast.  He has had 2 normal EEGs and MRIs.  Patient states that approximately 10:30 AM this morning experienced an episode of approximately 30 minutes of acting "off "he states that this is similar that he says that he has had no past when he was diagnosed with transient global amnesia.  He states that however it feels much more mild than those episodes.  He states that he had just finished having sex with his wife and taken a shower and spent some time sitting on the couch and got up to walk across the room when family his wife states that he began repeating things such as "I think I am having a stroke" and was not answering questions appropriately.  Wife states that at no point did patient lose consciousness or exhibit any seizure-like activity.  Patient is able to walk and was correlated during this episode.  Patient states that episode and he came to ED for evaluation.  He states he feels otherwise well denies any infectious symptoms fevers, chills, abdominal pain, nausea, chest pain, diarrhea, headache, lightheadedness, dizziness.  Patient denies any confusion this time.  Wife at bedside states that patient was at his baseline.     Past Medical History:  Diagnosis Date  . Hyperlipemia   . Hypertension   . Transient global amnesia 02/13/2018  . Vertigo     Patient Active Problem List   Diagnosis Date Noted  . Transient global amnesia 02/13/2018  . Awareness alteration, transient 05/31/2015    Past Surgical History:  Procedure Laterality Date  . KNEE SURGERY      . skin cancer resection Left    ear       Family History  Problem Relation Age of Onset  . COPD Mother   . Cancer Father   . Diabetes Sister   . Diabetes Brother     Social History   Tobacco Use  . Smoking status: Former Smoker    Quit date: 05/07/1988    Years since quitting: 31.2  . Smokeless tobacco: Never Used  Substance Use Topics  . Alcohol use: Yes    Alcohol/week: 1.0 standard drinks    Types: 1 Standard drinks or equivalent per week  . Drug use: No    Home Medications Prior to Admission medications   Medication Sig Start Date End Date Taking? Authorizing Provider  atorvastatin (LIPITOR) 20 MG tablet Take 20 mg by mouth daily.    [provider]  cholecalciferol (VITAMIN D) 1000 UNITS tablet Take 1,000 Units by mouth daily.    [provider]  hydrochlorothiazide (HYDRODIURIL) 25 MG tablet Take 12.5 mg by mouth daily. 03/19/15   [provider]  levETIRAcetam (KEPPRA) 500 MG tablet Take 1 tablet (500 mg total) by mouth 2 (two) times daily for 14 days. 08/15/19 08/29/19  Tedd Sias, PA  lisinopril (PRINIVIL,ZESTRIL) 20 MG tablet Take 25 mg by mouth daily.    [provider]  RABEprazole (ACIPHEX) 20 MG tablet Take 20 mg by mouth daily.  [provider]    Allergies    Patient has no known allergies.  Review of Systems   Review of Systems  Constitutional: Negative for chills and fever.  HENT: Negative for congestion.   Eyes: Negative for pain.  Respiratory: Negative for cough and shortness of breath.   Cardiovascular: Negative for chest pain and leg swelling.  Gastrointestinal: Negative for abdominal pain and vomiting.  Genitourinary: Negative for dysuria.  Musculoskeletal: Negative for myalgias and neck pain.  Skin: Negative for rash.  Neurological: Negative for dizziness and headaches.       AMS    Physical Exam Updated Vital Signs BP (!) 142/82 (BP Location: Right Arm)   Pulse 65   Temp 98.3 F  (36.8 C) (Oral)   Resp 18   Ht 6' (1.829 m)   Wt 93 kg   SpO2 98%   BMI 27.80 kg/m   Physical Exam Vitals and nursing note reviewed.  Constitutional:      General: He is not in acute distress. HENT:     Head: Normocephalic and atraumatic.     Nose: Nose normal.     Mouth/Throat:     Mouth: Mucous membranes are moist.  Eyes:     General: No scleral icterus.    Extraocular Movements: Extraocular movements intact.     Pupils: Pupils are equal, round, and reactive to light.  Cardiovascular:     Rate and Rhythm: Normal rate and regular rhythm.     Pulses: Normal pulses.     Heart sounds: Normal heart sounds.  Pulmonary:     Effort: Pulmonary effort is normal. No respiratory distress.     Breath sounds: No wheezing.  Abdominal:     Palpations: Abdomen is soft.     Tenderness: There is no abdominal tenderness. There is no right CVA tenderness, left CVA tenderness, guarding or rebound.  Musculoskeletal:     Cervical back: Normal range of motion.     Right lower leg: No edema.     Left lower leg: No edema.     Comments: Strength 5/5 bilateral lower extremities and bilateral upper extremities  Skin:    General: Skin is warm and dry.     Capillary Refill: Capillary refill takes less than 2 seconds.  Neurological:     Mental Status: He is alert. Mental status is at baseline.     Comments: Alert and oriented to self, place, time and event.   Speech is fluent, clear without dysarthria or dysphasia.   Strength 5/5 in upper/lower extremities  Sensation intact in upper/lower extremities   Normal gait.  Negative Romberg. No pronator drift.  Normal finger-to-nose and feet tapping.  CN I not tested  CN II grossly intact visual fields bilaterally. Did not visualize posterior eye.   CN III, IV, VI PERRLA and EOMs intact bilaterally  CN V Intact sensation to sharp and light touch to the face  CN VII facial movements symmetric  CN VIII not tested  CN IX, X no uvula deviation,  symmetric rise of soft palate  CN XI 5/5 SCM and trapezius strength bilaterally  CN XII Midline tongue protrusion, symmetric L/R movements   Psychiatric:        Mood and Affect: Mood normal.        Behavior: Behavior normal.     ED Results / Procedures / Treatments   Labs (all labs ordered are listed, but only abnormal results are displayed) Labs Reviewed  CBC WITH DIFFERENTIAL/PLATELET - Abnormal; Notable  for the following components:      Result Value   WBC 13.2 (*)    Neutro Abs 10.3 (*)    All other components within normal limits  COMPREHENSIVE METABOLIC PANEL - Abnormal; Notable for the following components:   CO2 21 (*)    Glucose, Bld 129 (*)    All other components within normal limits  CBG MONITORING, ED - Abnormal; Notable for the following components:   Glucose-Capillary 118 (*)    All other components within normal limits  TSH    EKG EKG Interpretation  Date/Time:  Saturday August 15 2019 12:47:23 EDT Ventricular Rate:  72 PR Interval:    QRS Duration: 110 QT Interval:  388 QTC Calculation: 425 R Axis:   -72 Text Interpretation: Sinus rhythm LAD, consider left anterior fascicular block Baseline wander in lead(s) V4 V5 V6 No significant change since last tracing Confirmed by Deno Etienne [phone]) on 08/15/2019 2:19:15 PM   Radiology CT Angio Head W or Wo Contrast  Result Date: 08/15/2019 CLINICAL DATA:  Hypertension, confusion EXAM: CT ANGIOGRAPHY HEAD AND NECK TECHNIQUE: Multidetector CT imaging of the head and neck was performed using the standard protocol during bolus administration of intravenous contrast. Multiplanar CT image reconstructions and MIPs were obtained to evaluate the vascular anatomy. Carotid stenosis measurements (when applicable) are obtained utilizing NASCET criteria, using the distal internal carotid diameter as the denominator. CONTRAST:  161mL OMNIPAQUE IOHEXOL 350 MG/ML SOLN COMPARISON:  CT head 2019 FINDINGS: CT HEAD Brain: There is no  acute intracranial hemorrhage, mass effect, or edema. Gray-white differentiation is preserved. There is no extra-axial fluid collection. Ventricles and sulci are within normal limits in size and configuration. Vascular: There is mild atherosclerotic calcification at the skull base. Skull: Calvarium is unremarkable. Sinuses/Orbits: No acute finding. Other: None. Review of the MIP images confirms the above findings CTA NECK Aortic arch: Great vessel origins are patent. There is calcified plaque at the left subclavian origin without significant stenosis. Right carotid system: Patent. Calcified plaque at the ICA origin causing less than 50% stenosis. Minimal calcified plaque along the distal cervical ICA. Left carotid system: Patent. Mild atherosclerotic wall thickening and calcified and noncalcified plaque along the common carotid. There is primarily calcified plaque along the proximal ICA causing up to approximately 60% stenosis. There is suboptimal evaluation due to streak artifact from dental amalgam at this level. Vertebral arteries: Patent. Left vertebral artery is slightly dominant. Minimal calcified plaque. Skeleton: Degenerative changes of the cervical spine. Other neck: No mass or adenopathy. Upper chest: No apical lung mass. Review of the MIP images confirms the above findings CTA HEAD Anterior circulation: Intracranial internal carotid arteries are patent with mild calcified plaque. Anterior and middle cerebral arteries are patent. Posterior circulation: Intracranial vertebral arteries are patent with mild calcified plaque. Basilar artery is patent. Posterior cerebral arteries are patent. Small right posterior communicating artery is identified. Venous sinuses: Patent as allowed by contrast bolus timing. Review of the MIP images confirms the above findings IMPRESSION: No acute intracranial hemorrhage, mass effect, or evidence of acute infarction. No large vessel occlusion or evidence of dissection. Plaque at  the right ICA origin causes less than 50% stenosis. Plaque at the left ICA origin causes up to approximately 60% stenosis, noting suboptimal evaluation due to artifact from dental amalgam. No significant intracranial stenosis or aneurysm. Electronically Signed   By: Macy Mis M.D.   On: 08/15/2019 14:42   CT Angio Neck W and/or Wo Contrast  Result Date: 08/15/2019  CLINICAL DATA:  Hypertension, confusion EXAM: CT ANGIOGRAPHY HEAD AND NECK TECHNIQUE: Multidetector CT imaging of the head and neck was performed using the standard protocol during bolus administration of intravenous contrast. Multiplanar CT image reconstructions and MIPs were obtained to evaluate the vascular anatomy. Carotid stenosis measurements (when applicable) are obtained utilizing NASCET criteria, using the distal internal carotid diameter as the denominator. CONTRAST:  144mL OMNIPAQUE IOHEXOL 350 MG/ML SOLN COMPARISON:  CT head 2019 FINDINGS: CT HEAD Brain: There is no acute intracranial hemorrhage, mass effect, or edema. Gray-white differentiation is preserved. There is no extra-axial fluid collection. Ventricles and sulci are within normal limits in size and configuration. Vascular: There is mild atherosclerotic calcification at the skull base. Skull: Calvarium is unremarkable. Sinuses/Orbits: No acute finding. Other: None. Review of the MIP images confirms the above findings CTA NECK Aortic arch: Great vessel origins are patent. There is calcified plaque at the left subclavian origin without significant stenosis. Right carotid system: Patent. Calcified plaque at the ICA origin causing less than 50% stenosis. Minimal calcified plaque along the distal cervical ICA. Left carotid system: Patent. Mild atherosclerotic wall thickening and calcified and noncalcified plaque along the common carotid. There is primarily calcified plaque along the proximal ICA causing up to approximately 60% stenosis. There is suboptimal evaluation due to streak  artifact from dental amalgam at this level. Vertebral arteries: Patent. Left vertebral artery is slightly dominant. Minimal calcified plaque. Skeleton: Degenerative changes of the cervical spine. Other neck: No mass or adenopathy. Upper chest: No apical lung mass. Review of the MIP images confirms the above findings CTA HEAD Anterior circulation: Intracranial internal carotid arteries are patent with mild calcified plaque. Anterior and middle cerebral arteries are patent. Posterior circulation: Intracranial vertebral arteries are patent with mild calcified plaque. Basilar artery is patent. Posterior cerebral arteries are patent. Small right posterior communicating artery is identified. Venous sinuses: Patent as allowed by contrast bolus timing. Review of the MIP images confirms the above findings IMPRESSION: No acute intracranial hemorrhage, mass effect, or evidence of acute infarction. No large vessel occlusion or evidence of dissection. Plaque at the right ICA origin causes less than 50% stenosis. Plaque at the left ICA origin causes up to approximately 60% stenosis, noting suboptimal evaluation due to artifact from dental amalgam. No significant intracranial stenosis or aneurysm. Electronically Signed   By: Macy Mis M.D.   On: 08/15/2019 14:42    Procedures .Critical Care Performed by: Tedd Sias, PA Authorized by: Tedd Sias, PA   Critical care provider statement:    Critical care time (minutes):  31   Critical care time was exclusive of:  Separately billable procedures and treating other patients and teaching time   Critical care was necessary to treat or prevent imminent or life-threatening deterioration of the following conditions: Altered mental status.   Critical care was time spent personally by me on the following activities:  Discussions with consultants, evaluation of patient's response to treatment, examination of patient, review of old charts, re-evaluation of patient's  condition, pulse oximetry, ordering and review of radiographic studies, ordering and review of laboratory studies and ordering and performing treatments and interventions   I assumed direction of critical care for this patient from another provider in my specialty: no     (including critical care time)  Medications Ordered in ED Medications  levETIRAcetam (KEPPRA) tablet 500 mg (500 mg Oral Given 08/15/19 1317)  iohexol (OMNIPAQUE) 350 MG/ML injection 100 mL (100 mLs Intravenous Contrast Given 08/15/19 1357)  ED Course  I have reviewed the triage vital signs and the nursing notes.  Pertinent labs & imaging results that were available during my care of the patient were reviewed by me and considered in my medical decision making (see chart for details).  Clinical Course as of Aug 14 1525  Sat Aug 15, 2019  1526 CMP unremarkable.  CBC very mild leukocytosis that is neutrophil predominant patient has no infectious symptoms doubt infectious etiology causing patient's TGA today.   [WF]  1526 EKG without any acute abnormality.   [WF]  1527 CT angiography of head and neck shows mild stenosis 50/60% of carotid arteries.  Doubt this is involved in patient's symptoms today.  This is incidental finding.  Discussed this with Dr. Tyrone Nine.   [WF]    Clinical Course User Index [WF] Tedd Sias, PA   MDM Rules/Calculators/A&P                      Patient is a 69 year old male with a history of transient global amnesia presented today after episode apart lasting roughly 30 minutes that occurred this morning after getting social intercourse with his wife.  Because of the exertional component of this history there was concern for cerebral aneurysmal disease.  Patient has had MRI next to and EEG asked to and followed by a neurologist Dr. Jannifer Franklin.  Plan per last note of Dr. Jannifer Franklin was to begin patient on antiepileptic medication-he had another recurrence of amnesia.  I discussed this case with my  attending physician who cosigned this note including patient's presenting symptoms, physical exam, and planned diagnostics and interventions. Attending physician stated agreement with plan or made changes to plan which were implemented.   Attending physician assessed patient at bedside.  Primary concerns for aneurysm versus seizure.  Low suspicion for stroke.  Patient has normal neurologic exam.  Is mentating well and baseline at this time.  Discussed case with Dr. Lorraine Lax --recommended CT angiography of head and neck, Keppra without loading dose 500 mg twice daily and recommended in 6 months with no driving.  I discussed this case with my attending physician who cosigned this note including patient's presenting symptoms, physical exam, and planned diagnostics and interventions. Attending physician stated agreement with plan or made changes to plan which were implemented.   Attending physician assessed patient at bedside.  We will discharge patient with close follow-up with his neurologist.  We will also discharged with Keppra per recommendation of neurology on call today.  Patient was given the following discharge information:  You have had to have some anal carotid artery plaques which is normal for age and high cholesterol.  This was an incidental finding.  Please discuss this with your primary care doctor when you follow-up with them. Please follow-up with Margette Fast your neurologist.  Please call Monday morning to make an appointment.  Please take Keppra 500 mg twice daily. Please refrain from driving, swimming, climbing to great heights, operating heavy machinery until you are cleared for this by her neurologist.  Patient ambulatory at time of discharge.  Repeat neuro exam remains normal.  She continues to be mentating at baseline.  Wife confirms no altered mental status during ED visit.  The medical records were personally reviewed by myself. I personally reviewed all lab results and  interpreted all imaging studies and either concurred with their official read or contacted radiology for clarification. Additional history obtained from old records/EMS/family members.q  This patient appears reasonably screened and  I doubt any other medical condition requiring further workup, evaluation, or treatment in the ED at this time prior to discharge.   Patient's vitals are WNL apart from vital sign abnormalities discussed above, patient is in NAD, and able to ambulate in the ED at their baseline and able to tolerate PO.  Pain has been managed or a plan has been made for home management and has no complaints prior to discharge. Patient is comfortable with above plan and for discharge at this time. All questions were answered prior to disposition. Results from the ER workup discussed with the patient face to face and all questions answered to the best of my ability. The patient is safe for discharge with strict return precautions. Patient appears safe for discharge with appropriate follow-up. Conveyed my impression with the patient and they voiced understanding and are agreeable to plan.   An After Visit Summary was printed and given to the patient.  Portions of this note were generated with [employer]. Dictation errors may occur despite best attempts at proofreading.    Final Clinical Impression(s) / ED Diagnoses Final diagnoses:  Transient global amnesia    Rx / DC Orders ED Discharge Orders         Ordered    levETIRAcetam (KEPPRA) 500 MG tablet  2 times daily     08/15/19 1507           Tedd Sias, [place] 08/15/19 1525    Tedd Sias, [place] 08/15/19 E. Lopez, [place], DO 08/16/19 [phone]

## 2019-08-15 NOTE — Discharge Instructions (Addendum)
You have had to have some anal carotid artery plaques which is normal for age and high cholesterol.  This was an incidental finding.  Please discuss this with your primary care doctor when you follow-up with them.  Please follow-up with Margette Fast your neurologist.  Please call Monday morning to make an appointment.  Please take Keppra 500 mg twice daily.  Please refrain from driving, swimming, climbing to great heights, operating heavy machinery until you are cleared for this by her neurologist.

## 2019-08-27 DIAGNOSIS — I10 Essential (primary) hypertension: Secondary | ICD-10-CM | POA: Diagnosis not present

## 2019-08-27 DIAGNOSIS — R7301 Impaired fasting glucose: Secondary | ICD-10-CM | POA: Diagnosis not present

## 2019-08-27 DIAGNOSIS — G454 Transient global amnesia: Secondary | ICD-10-CM | POA: Diagnosis not present

## 2019-08-27 DIAGNOSIS — I679 Cerebrovascular disease, unspecified: Secondary | ICD-10-CM | POA: Diagnosis not present

## 2019-09-02 ENCOUNTER — Ambulatory Visit: Payer: Medicare Other | Admitting: Diagnostic Neuroimaging

## 2019-09-02 ENCOUNTER — Encounter: Payer: Self-pay | Admitting: *Deleted

## 2019-09-02 ENCOUNTER — Other Ambulatory Visit: Payer: Self-pay

## 2019-09-02 VITALS — BP 142/85 | HR 66 | Temp 98.3°F | Ht 72.0 in | Wt 201.8 lb

## 2019-09-02 DIAGNOSIS — G454 Transient global amnesia: Secondary | ICD-10-CM

## 2019-09-02 NOTE — Patient Instructions (Signed)
TRANSIENT GLOBAL AMNESIA (triggered by argument, exertion, sexual activity) - monitor for now; not likely to be seizure - continue aspirin 81mg  daily, BP control, lipid control

## 2019-09-02 NOTE — Progress Notes (Signed)
[facility]  PATIENT: Daniel Murphy DOB: 15-Aug-1950  REFERRING CLINICIAN: Reynold Bowen, MD HISTORY FROM: patient  REASON FOR VISIT: new consult    HISTORICAL  CHIEF COMPLAINT:  Chief Complaint  Patient presents with  . Transient global amnesia    rm 7 "4/10 third time it's happened, never any pain, it's like taking a nap; never took Keppra"    HISTORY OF PRESENT ILLNESS:   69 year old male here for evaluation of transient global amnesia and abnormal spell.  Patient has had 3 episodes in his life (2017, 2019, 2021) where he has transient confusion, difficulty using items like the remote control, memory lapse lasting a few hours at a time.  He has been evaluated in the past by Dr. Jannifer Franklin with EEG and MRI which have been unremarkable.  Episodes typically triggered by certain factors such as heated argument, physical exertion or sexual activity.  Episodes last for several hours at a time.  No numbness or weakness.  No loss of consciousness.  No convulsions.  Patient had event on 08/15/2019 and went to emergency room.  He was evaluated with imaging and lab testing.  He was discharged on levetiracetam 500 mg twice a day.  Patient did not fill medication because he want to discuss with [place] first.  Since that time no further events.   REVIEW OF SYSTEMS: Full 14 system review of systems performed and negative with exception of: As per HPI.  ALLERGIES: No Known Allergies  HOME MEDICATIONS: Outpatient Medications Prior to Visit  Medication Sig Dispense Refill  . atorvastatin (LIPITOR) 20 MG tablet Take 20 mg by mouth daily.    . cholecalciferol (VITAMIN D) 1000 UNITS tablet Take 1,000 Units by mouth daily.    Marland Kitchen ezetimibe (ZETIA) 10 MG tablet Take 10 mg by mouth daily.    . hydrochlorothiazide (HYDRODIURIL) 25 MG tablet Take 12.5 mg by mouth daily.    Marland Kitchen lisinopril (PRINIVIL,ZESTRIL) 20 MG tablet Take 25 mg by mouth daily.    . RABEprazole (ACIPHEX) 20 MG tablet  Take 20 mg by mouth daily.    Marland Kitchen levETIRAcetam (KEPPRA) 500 MG tablet Take 1 tablet (500 mg total) by mouth 2 (two) times daily for 14 days. 28 tablet 0   No facility-administered medications prior to visit.    PAST MEDICAL HISTORY: Past Medical History:  Diagnosis Date  . BPV (benign positional vertigo)   . GERD (gastroesophageal reflux disease)   . Hyperlipemia   . Hypertension   . Transient global amnesia 02/13/2018  . Vertigo     PAST SURGICAL HISTORY: Past Surgical History:  Procedure Laterality Date  . KNEE SURGERY    . skin cancer resection Left    ear    FAMILY HISTORY: Family History  Problem Relation Age of Onset  . COPD Mother   . Cancer Father   . Diabetes Sister   . Diabetes Brother     SOCIAL HISTORY: Social History   Socioeconomic History  . Marital status: Married    Spouse name: Hassan Rowan  . Number of children: 2  . Years of education: HS  . Highest education level: Not on file  Occupational History  . 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 Smoking status: Former Smoker    Quit date: 05/07/1988    Years since quitting: 31.3  . Smokeless tobacco: Never Used  Substance and Sexual Activity  . Alcohol use: Yes    Alcohol/week: 1.0 standard drinks  Types: 1 Standard drinks or equivalent per week  . Drug use: No  . Sexual activity: Not on file  Other Topics Concern  . Not on file  Social History Narrative   Patient drinks 1 cup of coffee daily.   Patient is right handed.    Social Determinants of Health   Financial Resource Strain:   . Difficulty of Paying Living Expenses:   Food Insecurity:   . Worried About Charity fundraiser in the Last Year:   . Arboriculturist in the Last Year:   Transportation Needs:   . Film/video editor (Medical):   Marland Kitchen Lack of Transportation (Non-Medical):   Physical Activity:   . Days of Exercise per Week:   . Minutes of Exercise per Session:   Stress:   . Feeling of Stress  :   Social Connections:   . Frequency of Communication with Friends and Family:   . Frequency of Social Gatherings with Friends and Family:   . Attends Religious Services:   . Active Member of Clubs or Organizations:   . Attends Archivist Meetings:   Marland Kitchen Marital Status:   Intimate Partner Violence:   . Fear of Current or Ex-Partner:   . Emotionally Abused:   Marland Kitchen Physically Abused:   . Sexually Abused:      PHYSICAL EXAM  GENERAL EXAM/CONSTITUTIONAL: Vitals:  Vitals:   09/02/19 1335  BP: (!) 142/85  Pulse: 66  Temp: 98.3 F (36.8 C)  Weight: 201 lb 12.8 oz (91.5 kg)  Height: 6' (1.829 m)     Body mass index is 27.37 kg/m. Wt Readings from Last 3 Encounters:  09/02/19 201 lb 12.8 oz (91.5 kg)  08/15/19 205 lb (93 kg)  02/13/18 216 lb (98 kg)     Patient is in no distress; well developed, nourished and groomed; neck is supple  CARDIOVASCULAR:  Examination of carotid arteries is normal; no carotid bruits  Regular rate and rhythm, no murmurs  Examination of peripheral vascular system by observation and palpation is normal  EYES:  Ophthalmoscopic exam of optic discs and posterior segments is normal; no papilledema or hemorrhages  No exam data present  MUSCULOSKELETAL:  Gait, strength, tone, movements noted in Neurologic exam below  NEUROLOGIC: MENTAL STATUS:  No flowsheet data found.  awake, alert, oriented to person, place and time  recent and remote memory intact  normal attention and concentration  language fluent, comprehension intact, naming intact  fund of knowledge appropriate  CRANIAL NERVE:   2nd - no papilledema on fundoscopic exam  2nd, 3rd, 4th, 6th - pupils equal and reactive to light, visual fields full to confrontation, extraocular muscles intact, no nystagmus  5th - facial sensation symmetric  7th - facial strength symmetric  8th - hearing intact  9th - palate elevates symmetrically, uvula midline  11th -  shoulder shrug symmetric  12th - tongue protrusion midline  MOTOR:   normal bulk and tone, full strength in the BUE, BLE  SENSORY:   normal and symmetric to light touch, temperature, vibration  COORDINATION:   finger-nose-finger, fine finger movements normal  REFLEXES:   deep tendon reflexes present and symmetric  GAIT/STATION:   narrow based gait     DIAGNOSTIC DATA (LABS, IMAGING, TESTING) - I reviewed patient records, labs, notes, testing and imaging myself where available.  Lab Results  Component Value Date   WBC 13.2 (H) 08/15/2019   HGB 14.7 08/15/2019   HCT 44.2 08/15/2019   MCV  94.8 08/15/2019   PLT 224 08/15/2019      Component Value Date/Time   NA 137 08/15/2019 1304   K 4.1 08/15/2019 1304   CL 108 08/15/2019 1304   CO2 21 (L) 08/15/2019 1304   GLUCOSE 129 (H) 08/15/2019 1304   BUN 18 08/15/2019 1304   CREATININE 0.96 08/15/2019 1304   CALCIUM 9.5 08/15/2019 1304   PROT 7.8 08/15/2019 1304   ALBUMIN 4.4 08/15/2019 1304   AST 23 08/15/2019 1304   ALT 23 08/15/2019 1304   ALKPHOS 60 08/15/2019 1304   BILITOT 0.9 08/15/2019 1304   GFRNONAA >60 08/15/2019 1304   GFRAA >60 08/15/2019 1304   No results found for: CHOL, HDL, LDLCALC, LDLDIRECT, TRIG, CHOLHDL No results found for: HGBA1C No results found for: VITAMINB12 Lab Results  Component Value Date   TSH 2.648 08/15/2019    06/20/15 EEG - normal  02/19/18 EEG - normal  12/21/17 MRI brain IMPRESSION: Normal brain MRI for age.  No acute intracranial abnormality.  08/15/19 CTA head / neck No acute intracranial hemorrhage, mass effect, or evidence of acute infarction.  No large vessel occlusion or evidence of dissection. Plaque at the right ICA origin causes less than 50% stenosis. Plaque at the left ICA origin causes up to approximately 60% stenosis, noting suboptimal evaluation due to artifact from dental amalgam.  No significant intracranial stenosis or  aneurysm.    ASSESSMENT AND PLAN  69 y.o. year old male here with recurrent episodes of transient confusion, amnesia, lasting few hours at a time each, typically triggered by certain situations.  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  Dx:  1. Transient global amnesia     PLAN:  RECURRENT TRANSIENT GLOBAL AMNESIA (triggered by argument, exertion, sexual activity) - monitor for now; not likely to be seizure; recurrent TGA can occur in up to 25% of patients, but not associated with long-term neurologic sequelae - reassured patient and educated on general healthy measures and encouraged him to contact us if he has further events. - continue aspirin 81mg  daily, BP control, lipid control  Return for pending if symptoms worsen or fail to improve.    Penni Bombard, MD [id number], [id number] PM Certified in Neurology, Neurophysiology and Neuroimaging  [facility] [address] [place], [place] [address] [phone]

## 2019-10-01 ENCOUNTER — Ambulatory Visit: Payer: 59 | Admitting: Neurology

## 2019-10-23 DIAGNOSIS — I679 Cerebrovascular disease, unspecified: Secondary | ICD-10-CM | POA: Diagnosis not present

## 2019-10-23 DIAGNOSIS — G454 Transient global amnesia: Secondary | ICD-10-CM | POA: Diagnosis not present

## 2019-10-23 DIAGNOSIS — D126 Benign neoplasm of colon, unspecified: Secondary | ICD-10-CM | POA: Diagnosis not present

## 2019-10-23 DIAGNOSIS — E7849 Other hyperlipidemia: Secondary | ICD-10-CM | POA: Diagnosis not present

## 2019-11-05 DIAGNOSIS — H524 Presbyopia: Secondary | ICD-10-CM | POA: Diagnosis not present

## 2019-11-17 DIAGNOSIS — L918 Other hypertrophic disorders of the skin: Secondary | ICD-10-CM | POA: Diagnosis not present

## 2019-11-17 DIAGNOSIS — L570 Actinic keratosis: Secondary | ICD-10-CM | POA: Diagnosis not present

## 2019-11-17 DIAGNOSIS — D225 Melanocytic nevi of trunk: Secondary | ICD-10-CM | POA: Diagnosis not present

## 2019-11-17 DIAGNOSIS — Z85828 Personal history of other malignant neoplasm of skin: Secondary | ICD-10-CM | POA: Diagnosis not present

## 2019-12-01 DIAGNOSIS — Z0120 Encounter for dental examination and cleaning without abnormal findings: Secondary | ICD-10-CM | POA: Diagnosis not present

## 2019-12-10 ENCOUNTER — Ambulatory Visit: Payer: 59 | Admitting: Neurology

## 2020-01-26 DIAGNOSIS — G454 Transient global amnesia: Secondary | ICD-10-CM | POA: Diagnosis not present

## 2020-01-26 DIAGNOSIS — R413 Other amnesia: Secondary | ICD-10-CM | POA: Diagnosis not present

## 2020-02-02 DIAGNOSIS — M5430 Sciatica, unspecified side: Secondary | ICD-10-CM | POA: Diagnosis not present

## 2020-03-05 ENCOUNTER — Ambulatory Visit: Payer: 59 | Attending: Internal Medicine

## 2020-03-05 DIAGNOSIS — Z23 Encounter for immunization: Secondary | ICD-10-CM

## 2020-03-05 NOTE — Progress Notes (Signed)
   Covid-19 Vaccination Clinic  Name:  Daniel Murphy    MRN: [id number] DOB: [birth year]  03/05/2020  Daniel Murphy was observed post Covid-19 immunization for 15 minutes without incident. He was provided with Vaccine Information Sheet and instruction to access the V-Safe system.   Daniel Murphy was instructed to call 911 with any severe reactions post vaccine: Marland Kitchen Difficulty breathing  . Swelling of face and throat  . A fast heartbeat  . A bad rash all over body  . Dizziness and weakness

## 2020-04-06 DIAGNOSIS — D485 Neoplasm of uncertain behavior of skin: Secondary | ICD-10-CM | POA: Diagnosis not present

## 2020-04-06 DIAGNOSIS — D1801 Hemangioma of skin and subcutaneous tissue: Secondary | ICD-10-CM | POA: Diagnosis not present

## 2020-04-06 DIAGNOSIS — L570 Actinic keratosis: Secondary | ICD-10-CM | POA: Diagnosis not present

## 2020-04-06 DIAGNOSIS — Z85828 Personal history of other malignant neoplasm of skin: Secondary | ICD-10-CM | POA: Diagnosis not present

## 2020-04-06 DIAGNOSIS — D0439 Carcinoma in situ of skin of other parts of face: Secondary | ICD-10-CM | POA: Diagnosis not present

## 2020-05-09 DIAGNOSIS — E785 Hyperlipidemia, unspecified: Secondary | ICD-10-CM | POA: Diagnosis not present

## 2020-05-09 DIAGNOSIS — Z125 Encounter for screening for malignant neoplasm of prostate: Secondary | ICD-10-CM | POA: Diagnosis not present

## 2020-05-09 DIAGNOSIS — R7301 Impaired fasting glucose: Secondary | ICD-10-CM | POA: Diagnosis not present

## 2020-05-09 DIAGNOSIS — E559 Vitamin D deficiency, unspecified: Secondary | ICD-10-CM | POA: Diagnosis not present

## 2020-05-16 DIAGNOSIS — R82998 Other abnormal findings in urine: Secondary | ICD-10-CM | POA: Diagnosis not present

## 2020-05-16 DIAGNOSIS — E785 Hyperlipidemia, unspecified: Secondary | ICD-10-CM | POA: Diagnosis not present

## 2020-05-24 DIAGNOSIS — Z1212 Encounter for screening for malignant neoplasm of rectum: Secondary | ICD-10-CM | POA: Diagnosis not present

## 2020-07-11 DIAGNOSIS — Z85828 Personal history of other malignant neoplasm of skin: Secondary | ICD-10-CM | POA: Diagnosis not present

## 2020-07-11 DIAGNOSIS — L814 Other melanin hyperpigmentation: Secondary | ICD-10-CM | POA: Diagnosis not present

## 2020-07-11 DIAGNOSIS — D225 Melanocytic nevi of trunk: Secondary | ICD-10-CM | POA: Diagnosis not present

## 2020-10-19 DIAGNOSIS — M542 Cervicalgia: Secondary | ICD-10-CM | POA: Diagnosis not present

## 2020-11-02 DIAGNOSIS — H40053 Ocular hypertension, bilateral: Secondary | ICD-10-CM | POA: Diagnosis not present

## 2020-11-14 DIAGNOSIS — I10 Essential (primary) hypertension: Secondary | ICD-10-CM | POA: Diagnosis not present

## 2020-11-14 DIAGNOSIS — I700 Atherosclerosis of aorta: Secondary | ICD-10-CM | POA: Diagnosis not present

## 2020-11-14 DIAGNOSIS — E785 Hyperlipidemia, unspecified: Secondary | ICD-10-CM | POA: Diagnosis not present

## 2020-11-14 DIAGNOSIS — R7301 Impaired fasting glucose: Secondary | ICD-10-CM | POA: Diagnosis not present

## 2020-11-21 DIAGNOSIS — D225 Melanocytic nevi of trunk: Secondary | ICD-10-CM | POA: Diagnosis not present

## 2020-11-21 DIAGNOSIS — L918 Other hypertrophic disorders of the skin: Secondary | ICD-10-CM | POA: Diagnosis not present

## 2020-11-21 DIAGNOSIS — M5030 Other cervical disc degeneration, unspecified cervical region: Secondary | ICD-10-CM | POA: Diagnosis not present

## 2020-11-21 DIAGNOSIS — S139XXA Sprain of joints and ligaments of unspecified parts of neck, initial encounter: Secondary | ICD-10-CM | POA: Diagnosis not present

## 2020-11-21 DIAGNOSIS — Z85828 Personal history of other malignant neoplasm of skin: Secondary | ICD-10-CM | POA: Diagnosis not present

## 2020-11-21 DIAGNOSIS — D2261 Melanocytic nevi of right upper limb, including shoulder: Secondary | ICD-10-CM | POA: Diagnosis not present

## 2020-12-19 DIAGNOSIS — M47812 Spondylosis without myelopathy or radiculopathy, cervical region: Secondary | ICD-10-CM | POA: Diagnosis not present

## 2021-01-13 DIAGNOSIS — H5213 Myopia, bilateral: Secondary | ICD-10-CM | POA: Diagnosis not present

## 2021-05-03 ENCOUNTER — Encounter (HOSPITAL_BASED_OUTPATIENT_CLINIC_OR_DEPARTMENT_OTHER): Payer: Self-pay

## 2021-05-03 ENCOUNTER — Other Ambulatory Visit: Payer: Self-pay

## 2021-05-03 DIAGNOSIS — U071 COVID-19: Secondary | ICD-10-CM | POA: Insufficient documentation

## 2021-05-03 DIAGNOSIS — Z87891 Personal history of nicotine dependence: Secondary | ICD-10-CM | POA: Insufficient documentation

## 2021-05-03 DIAGNOSIS — I10 Essential (primary) hypertension: Secondary | ICD-10-CM | POA: Diagnosis not present

## 2021-05-03 DIAGNOSIS — R519 Headache, unspecified: Secondary | ICD-10-CM | POA: Diagnosis present

## 2021-05-03 LAB — RESP PANEL BY RT-PCR (FLU A&B, COVID) ARPGX2
Influenza A by PCR: NEGATIVE
Influenza B by PCR: NEGATIVE
SARS Coronavirus 2 by RT PCR: POSITIVE — AB

## 2021-05-03 NOTE — ED Triage Notes (Signed)
Pt presents to the ED with chills, headache, cough, and fatigue. Does report mild nausea. Pt tested positive at home for covid. Pt A&Ox4 at time of triage. VSS

## 2021-05-04 ENCOUNTER — Emergency Department (HOSPITAL_BASED_OUTPATIENT_CLINIC_OR_DEPARTMENT_OTHER)
Admission: EM | Admit: 2021-05-04 | Discharge: 2021-05-04 | Disposition: A | Discharge status: Home / Self Care | Payer: Medicare Other | Attending: Emergency Medicine | Admitting: Emergency Medicine

## 2021-05-04 DIAGNOSIS — U071 COVID-19: Secondary | ICD-10-CM

## 2021-05-04 MED ORDER — NIRMATRELVIR/RITONAVIR (PAXLOVID)TABLET: 3.0000 | ORAL_TABLET | Freq: Two times a day (BID) | ORAL | 0 refills | Status: AC

## 2021-05-04 NOTE — ED Provider Notes (Signed)
[facility] EMERGENC[facility] DEPT Provider Note   CSN: [id number] Arrival date & time: 05/03/21  2240     History Chief Complaint  Patient presents with   Headache   Chills    Daniel Murphy is a 70 y.o. male.  The history is provided by the patient.  Headache He has history of hypertension, hyperlipidemia and comes in with 4-day history of difficulty speaking.  He denies any fever, chills, sweats.  He denies rhinorrhea or sore throat.  There has been a slight cough and some mild body aches.  He denies nausea, vomiting, diarrhea.  He was exposed to someone who had COVID-19.  He has completed COVID-19 vaccination including booster.   Past Medical History:  Diagnosis Date   BPV (benign positional vertigo)    GERD (gastroesophageal reflux disease)    Hyperlipemia    Hypertension    Transient global amnesia 02/13/2018   Vertigo     Patient Active Problem List   Diagnosis Date Noted   Transient global amnesia 02/13/2018   Awareness alteration, transient 05/31/2015    Past Surgical History:  Procedure Laterality Date   KNEE SURGERY     skin cancer resection Left    ear       Family History  Problem Relation Age of Onset   COPD Mother    Cancer Father    Diabetes Sister    Diabetes Brother     Social History   Tobacco Use   Smoking status: Former    Types: Cigarettes    Quit date: 05/07/1988    Years since quitting: 33.0   Smokeless tobacco: Never  Substance Use Topics   Alcohol use: Yes    Alcohol/week: 1.0 standard drink    Types: 1 Standard drinks or equivalent per week   Drug use: No    Home Medications Prior to Admission medications   Medication Sig Start Date End Date Taking? Authorizing Provider  atorvastatin (LIPITOR) 20 MG tablet Take 20 mg by mouth daily.    [provider]  cholecalciferol (VITAMIN D) 1000 UNITS tablet Take 1,000 Units by mouth daily.    [provider]  ezetimibe (ZETIA) 10 MG tablet Take 10 mg  by mouth daily. 06/15/19   [provider]  hydrochlorothiazide (HYDRODIURIL) 25 MG tablet Take 12.5 mg by mouth daily. 03/19/15   [provider]  lisinopril (PRINIVIL,ZESTRIL) 20 MG tablet Take 25 mg by mouth daily.    [provider]  RABEprazole (ACIPHEX) 20 MG tablet Take 20 mg by mouth daily.    [provider]    Allergies    Patient has no known allergies.  Review of Systems   Review of Systems  Neurological:  Positive for headaches.  All other systems reviewed and are negative.  Physical Exam Updated Vital Signs BP (!) 158/75    Pulse 71    Temp 98.5 F (36.9 C)    Resp 18    Ht 6' (1.829 m)    Wt 93.4 kg    SpO2 90%    BMI 27.94 kg/m   Physical Exam Vitals and nursing note reviewed.  70 year old male, resting comfortably and in no acute distress. Vital signs are significant for elevated blood pressure. Oxygen saturation is 90%, which is normal. Head is normocephalic and atraumatic. PERRLA, EOMI. Oropharynx is clear. Neck is nontender and supple without adenopathy or JVD. Back is nontender and there is no CVA tenderness. Lungs are clear without rales, wheezes, or  rhonchi. Chest is nontender. Heart has regular rate and rhythm without murmur. Abdomen is soft, flat, nontender. Extremities have no cyanosis or edema, full range of motion is present. Skin is warm and dry without rash. Neurologic: Mental status is normal, cranial nerves are intact, moves all extremities equally.  ED Results / Procedures / Treatments   Labs (all labs ordered are listed, but only abnormal results are displayed) Labs Reviewed  RESP PANEL BY RT-PCR (FLU A&B, COVID) ARPGX2 - Abnormal; Notable for the following components:      Result Value   SARS Coronavirus 2 by RT PCR POSITIVE (*)    All other components within normal limits   Procedures Procedures   Medications Ordered in ED Medications - No data to display  ED Course  I have reviewed the triage  vital signs and the nursing notes.  Pertinent lab results that were available during my care of the patient were reviewed by me and considered in my medical decision making (see chart for details).   MDM Rules/Calculators/A&P                         Viral illness with history of exposure to COVID-19.  Respiratory pathogen panel is positive for COVID-19.  He does have risk factors for severe disease including hypertension and advanced age, so he is offered a prescription for Paxlovid.  Recommended isolation for 5 days.  Follow-up with PCP as needed, return precautions discussed.  Old records are reviewed, and he has no relevant past visits.  Final Clinical Impression(s) / ED Diagnoses Final diagnoses:  COVID-19 virus infection    Rx / DC Orders ED Discharge Orders          Ordered    nirmatrelvir/ritonavir EUA (PAXLOVID) 20 x 150 MG & 10 x 100MG  TABS  2 times daily        05/04/21 2671             Delora Fuel, MD 24/58/09 [phone]

## 2021-05-15 DIAGNOSIS — E559 Vitamin D deficiency, unspecified: Secondary | ICD-10-CM | POA: Diagnosis not present

## 2021-05-15 DIAGNOSIS — E785 Hyperlipidemia, unspecified: Secondary | ICD-10-CM | POA: Diagnosis not present

## 2021-05-15 DIAGNOSIS — R7301 Impaired fasting glucose: Secondary | ICD-10-CM | POA: Diagnosis not present

## 2021-05-15 DIAGNOSIS — I10 Essential (primary) hypertension: Secondary | ICD-10-CM | POA: Diagnosis not present

## 2021-05-22 DIAGNOSIS — I10 Essential (primary) hypertension: Secondary | ICD-10-CM | POA: Diagnosis not present

## 2021-05-22 DIAGNOSIS — Z1212 Encounter for screening for malignant neoplasm of rectum: Secondary | ICD-10-CM | POA: Diagnosis not present

## 2021-05-22 DIAGNOSIS — R82998 Other abnormal findings in urine: Secondary | ICD-10-CM | POA: Diagnosis not present

## 2021-08-30 DIAGNOSIS — M25552 Pain in left hip: Secondary | ICD-10-CM | POA: Diagnosis not present

## 2021-08-30 DIAGNOSIS — W57XXXA Bitten or stung by nonvenomous insect and other nonvenomous arthropods, initial encounter: Secondary | ICD-10-CM | POA: Diagnosis not present

## 2021-08-30 DIAGNOSIS — S70262A Insect bite (nonvenomous), left hip, initial encounter: Secondary | ICD-10-CM | POA: Diagnosis not present

## 2021-11-22 DIAGNOSIS — I6523 Occlusion and stenosis of bilateral carotid arteries: Secondary | ICD-10-CM | POA: Diagnosis not present

## 2021-11-22 DIAGNOSIS — I10 Essential (primary) hypertension: Secondary | ICD-10-CM | POA: Diagnosis not present

## 2021-11-22 DIAGNOSIS — R7301 Impaired fasting glucose: Secondary | ICD-10-CM | POA: Diagnosis not present

## 2021-11-22 DIAGNOSIS — I700 Atherosclerosis of aorta: Secondary | ICD-10-CM | POA: Diagnosis not present

## 2021-11-23 DIAGNOSIS — D225 Melanocytic nevi of trunk: Secondary | ICD-10-CM | POA: Diagnosis not present

## 2021-11-23 DIAGNOSIS — L570 Actinic keratosis: Secondary | ICD-10-CM | POA: Diagnosis not present

## 2021-11-23 DIAGNOSIS — L821 Other seborrheic keratosis: Secondary | ICD-10-CM | POA: Diagnosis not present

## 2021-11-23 DIAGNOSIS — Z85828 Personal history of other malignant neoplasm of skin: Secondary | ICD-10-CM | POA: Diagnosis not present

## 2022-01-17 DIAGNOSIS — H5213 Myopia, bilateral: Secondary | ICD-10-CM | POA: Diagnosis not present

## 2022-01-17 DIAGNOSIS — H40013 Open angle with borderline findings, low risk, bilateral: Secondary | ICD-10-CM | POA: Diagnosis not present

## 2022-04-05 IMAGING — CT CT ANGIO HEAD
1 of 12 series · 5 of 33 positions shown · IV contrast (Omnipaque)
Comparison: CT head 4196

CLINICAL DATA: Hypertension, confusion

EXAM:
CT ANGIOGRAPHY HEAD AND NECK
TECHNIQUE: Multidetector CT imaging of the head and neck was performed using
the standard protocol during bolus administration of intravenous
contrast. Multiplanar CT image reconstructions and MIPs were
obtained to evaluate the vascular anatomy. Carotid stenosis
measurements (when applicable) are obtained utilizing NASCET
criteria, using the distal internal carotid diameter as the
denominator.
CONTRAST:  100mL OMNIPAQUE IOHEXOL 350 MG/ML SOLN

[Series 11: axial thin · axial · 0.46mm/px · z∈[-48,+184]mm · 5 of 350 slices shown]
[im 59/350  soft-tissue]
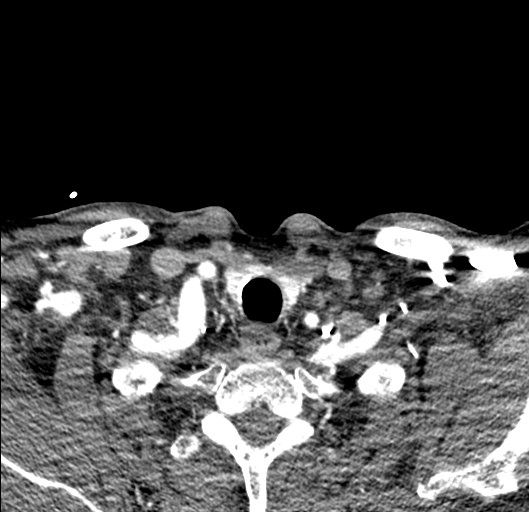
[im 117/350  bone]
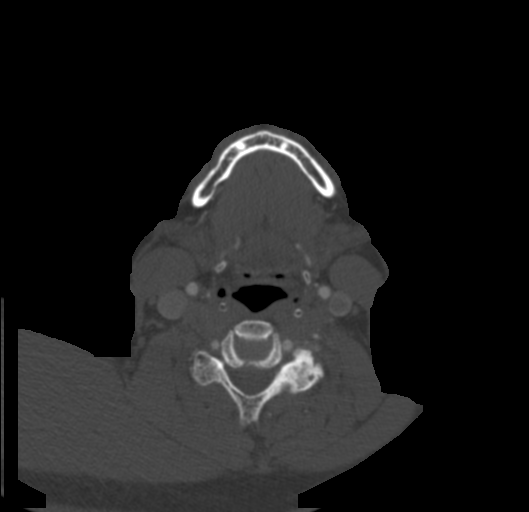
[im 175/350  soft-tissue]
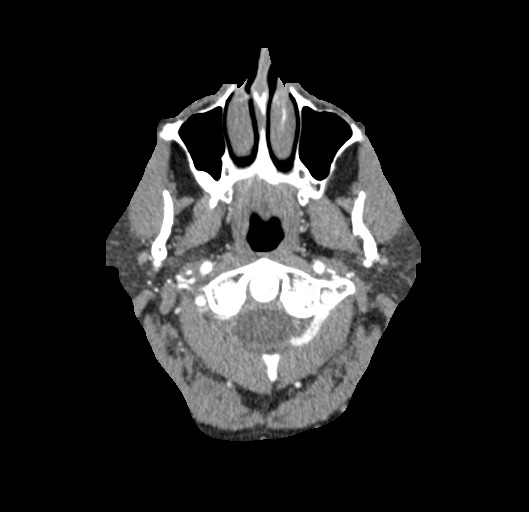
[im 233/350  bone]
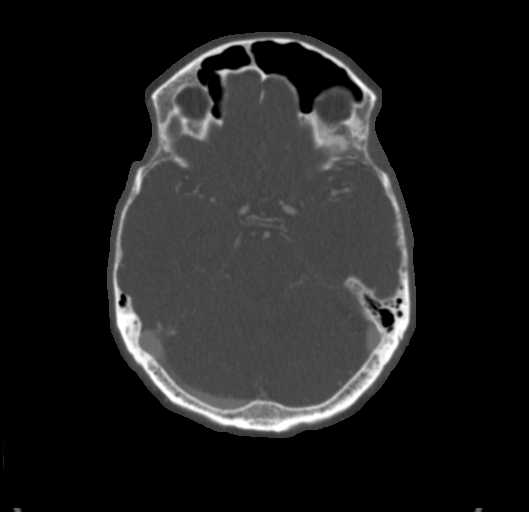
[im 291/350  soft-tissue]
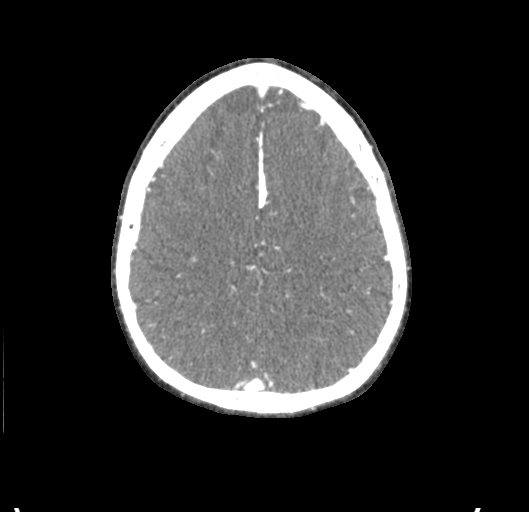

[5 of 33 positions shown; findings below may reference images not displayed]

FINDINGS: CT HEAD

Brain: There is no acute intracranial hemorrhage, mass effect, or
edema. Gray-white differentiation is preserved. There is no
extra-axial fluid collection. Ventricles and sulci are within normal
limits in size and configuration.

Vascular: There is mild atherosclerotic calcification at the skull
base.

Skull: Calvarium is unremarkable.

Sinuses/Orbits: No acute finding.

Other: None.

Review of the MIP images confirms the above findings

CTA NECK

Aortic arch: Great vessel origins are patent. There is calcified
plaque at the left subclavian origin without significant stenosis.

Right carotid system: Patent. Calcified plaque at the ICA origin
causing less than 50% stenosis. Minimal calcified plaque along the
distal cervical ICA.

Left carotid system: Patent. Mild atherosclerotic wall thickening
and calcified and noncalcified plaque along the common carotid.
There is primarily calcified plaque along the proximal ICA causing
up to approximately 60% stenosis. There is suboptimal evaluation due
to streak artifact from dental amalgam at this level.

Vertebral arteries: Patent. Left vertebral artery is slightly
dominant. Minimal calcified plaque.

Skeleton: Degenerative changes of the cervical spine.

Other neck: No mass or adenopathy.

Upper chest: No apical lung mass.

Review of the MIP images confirms the above findings

CTA HEAD

Anterior circulation: Intracranial internal carotid arteries are
patent with mild calcified plaque. Anterior and middle cerebral
arteries are patent.

Posterior circulation: Intracranial vertebral arteries are patent
with mild calcified plaque. Basilar artery is patent. Posterior
cerebral arteries are patent. Small right posterior communicating
artery is identified.

Venous sinuses: Patent as allowed by contrast bolus timing.

Review of the MIP images confirms the above findings
IMPRESSION: No acute intracranial hemorrhage, mass effect, or evidence of acute
infarction.

No large vessel occlusion or evidence of dissection. Plaque at the
right ICA origin causes less than 50% stenosis. Plaque at the left
ICA origin causes up to approximately 60% stenosis, noting
suboptimal evaluation due to artifact from dental amalgam.

No significant intracranial stenosis or aneurysm.

## 2022-05-16 DIAGNOSIS — Z09 Encounter for follow-up examination after completed treatment for conditions other than malignant neoplasm: Secondary | ICD-10-CM | POA: Diagnosis not present

## 2022-05-16 DIAGNOSIS — Z86010 Personal history of colonic polyps: Secondary | ICD-10-CM | POA: Diagnosis not present

## 2022-05-16 DIAGNOSIS — K635 Polyp of colon: Secondary | ICD-10-CM | POA: Diagnosis not present

## 2022-05-16 DIAGNOSIS — K5730 Diverticulosis of large intestine without perforation or abscess without bleeding: Secondary | ICD-10-CM | POA: Diagnosis not present

## 2022-05-16 DIAGNOSIS — K649 Unspecified hemorrhoids: Secondary | ICD-10-CM | POA: Diagnosis not present

## 2022-05-16 DIAGNOSIS — D125 Benign neoplasm of sigmoid colon: Secondary | ICD-10-CM | POA: Diagnosis not present

## 2022-05-18 DIAGNOSIS — K635 Polyp of colon: Secondary | ICD-10-CM | POA: Diagnosis not present

## 2022-05-18 DIAGNOSIS — D125 Benign neoplasm of sigmoid colon: Secondary | ICD-10-CM | POA: Diagnosis not present

## 2022-05-24 DIAGNOSIS — Z125 Encounter for screening for malignant neoplasm of prostate: Secondary | ICD-10-CM | POA: Diagnosis not present

## 2022-05-24 DIAGNOSIS — E559 Vitamin D deficiency, unspecified: Secondary | ICD-10-CM | POA: Diagnosis not present

## 2022-05-24 DIAGNOSIS — E785 Hyperlipidemia, unspecified: Secondary | ICD-10-CM | POA: Diagnosis not present

## 2022-05-24 DIAGNOSIS — R7301 Impaired fasting glucose: Secondary | ICD-10-CM | POA: Diagnosis not present

## 2022-05-24 DIAGNOSIS — I700 Atherosclerosis of aorta: Secondary | ICD-10-CM | POA: Diagnosis not present

## 2022-05-24 DIAGNOSIS — R7989 Other specified abnormal findings of blood chemistry: Secondary | ICD-10-CM | POA: Diagnosis not present

## 2022-05-24 DIAGNOSIS — I10 Essential (primary) hypertension: Secondary | ICD-10-CM | POA: Diagnosis not present

## 2022-05-29 DIAGNOSIS — R42 Dizziness and giddiness: Secondary | ICD-10-CM | POA: Diagnosis not present

## 2022-05-29 DIAGNOSIS — R059 Cough, unspecified: Secondary | ICD-10-CM | POA: Diagnosis not present

## 2022-05-29 DIAGNOSIS — Z1152 Encounter for screening for COVID-19: Secondary | ICD-10-CM | POA: Diagnosis not present

## 2022-05-29 DIAGNOSIS — R0981 Nasal congestion: Secondary | ICD-10-CM | POA: Diagnosis not present

## 2022-05-29 DIAGNOSIS — I10 Essential (primary) hypertension: Secondary | ICD-10-CM | POA: Diagnosis not present

## 2022-05-31 DIAGNOSIS — E559 Vitamin D deficiency, unspecified: Secondary | ICD-10-CM | POA: Diagnosis not present

## 2022-05-31 DIAGNOSIS — I10 Essential (primary) hypertension: Secondary | ICD-10-CM | POA: Diagnosis not present

## 2022-05-31 DIAGNOSIS — N401 Enlarged prostate with lower urinary tract symptoms: Secondary | ICD-10-CM | POA: Diagnosis not present

## 2022-05-31 DIAGNOSIS — I700 Atherosclerosis of aorta: Secondary | ICD-10-CM | POA: Diagnosis not present

## 2022-05-31 DIAGNOSIS — E785 Hyperlipidemia, unspecified: Secondary | ICD-10-CM | POA: Diagnosis not present

## 2022-05-31 DIAGNOSIS — Z1339 Encounter for screening examination for other mental health and behavioral disorders: Secondary | ICD-10-CM | POA: Diagnosis not present

## 2022-05-31 DIAGNOSIS — M5412 Radiculopathy, cervical region: Secondary | ICD-10-CM | POA: Diagnosis not present

## 2022-05-31 DIAGNOSIS — Z1331 Encounter for screening for depression: Secondary | ICD-10-CM | POA: Diagnosis not present

## 2022-05-31 DIAGNOSIS — Z0000 Encounter for general adult medical examination without abnormal findings: Secondary | ICD-10-CM | POA: Diagnosis not present

## 2022-05-31 DIAGNOSIS — G454 Transient global amnesia: Secondary | ICD-10-CM | POA: Diagnosis not present

## 2022-05-31 DIAGNOSIS — R82998 Other abnormal findings in urine: Secondary | ICD-10-CM | POA: Diagnosis not present

## 2022-05-31 DIAGNOSIS — I6523 Occlusion and stenosis of bilateral carotid arteries: Secondary | ICD-10-CM | POA: Diagnosis not present

## 2022-05-31 DIAGNOSIS — R7301 Impaired fasting glucose: Secondary | ICD-10-CM | POA: Diagnosis not present

## 2022-06-11 ENCOUNTER — Ambulatory Visit (HOSPITAL_COMMUNITY)
Admission: RE | Admit: 2022-06-11 | Discharge: 2022-06-11 | Disposition: A | Discharge status: Home / Self Care | Payer: Medicare HMO | Source: Ambulatory Visit | Attending: Surgery | Admitting: Surgery

## 2022-06-11 ENCOUNTER — Other Ambulatory Visit (HOSPITAL_COMMUNITY): Payer: Self-pay | Admitting: Endocrinology

## 2022-06-11 DIAGNOSIS — I6529 Occlusion and stenosis of unspecified carotid artery: Secondary | ICD-10-CM | POA: Diagnosis not present

## 2022-07-31 DIAGNOSIS — Z87891 Personal history of nicotine dependence: Secondary | ICD-10-CM | POA: Diagnosis not present

## 2022-07-31 DIAGNOSIS — K219 Gastro-esophageal reflux disease without esophagitis: Secondary | ICD-10-CM | POA: Diagnosis not present

## 2022-07-31 DIAGNOSIS — I10 Essential (primary) hypertension: Secondary | ICD-10-CM | POA: Diagnosis not present

## 2022-07-31 DIAGNOSIS — N529 Male erectile dysfunction, unspecified: Secondary | ICD-10-CM | POA: Diagnosis not present

## 2022-07-31 DIAGNOSIS — E785 Hyperlipidemia, unspecified: Secondary | ICD-10-CM | POA: Diagnosis not present

## 2022-09-11 DIAGNOSIS — Z0100 Encounter for examination of eyes and vision without abnormal findings: Secondary | ICD-10-CM | POA: Diagnosis not present

## 2022-12-03 DIAGNOSIS — G454 Transient global amnesia: Secondary | ICD-10-CM | POA: Diagnosis not present

## 2022-12-03 DIAGNOSIS — R7301 Impaired fasting glucose: Secondary | ICD-10-CM | POA: Diagnosis not present

## 2022-12-03 DIAGNOSIS — D126 Benign neoplasm of colon, unspecified: Secondary | ICD-10-CM | POA: Diagnosis not present

## 2022-12-03 DIAGNOSIS — N401 Enlarged prostate with lower urinary tract symptoms: Secondary | ICD-10-CM | POA: Diagnosis not present

## 2022-12-03 DIAGNOSIS — K219 Gastro-esophageal reflux disease without esophagitis: Secondary | ICD-10-CM | POA: Diagnosis not present

## 2022-12-03 DIAGNOSIS — I10 Essential (primary) hypertension: Secondary | ICD-10-CM | POA: Diagnosis not present

## 2022-12-03 DIAGNOSIS — I700 Atherosclerosis of aorta: Secondary | ICD-10-CM | POA: Diagnosis not present

## 2022-12-03 DIAGNOSIS — E785 Hyperlipidemia, unspecified: Secondary | ICD-10-CM | POA: Diagnosis not present

## 2022-12-03 DIAGNOSIS — M5412 Radiculopathy, cervical region: Secondary | ICD-10-CM | POA: Diagnosis not present

## 2022-12-03 DIAGNOSIS — I6523 Occlusion and stenosis of bilateral carotid arteries: Secondary | ICD-10-CM | POA: Diagnosis not present

## 2022-12-03 DIAGNOSIS — E559 Vitamin D deficiency, unspecified: Secondary | ICD-10-CM | POA: Diagnosis not present

## 2022-12-11 DIAGNOSIS — H40013 Open angle with borderline findings, low risk, bilateral: Secondary | ICD-10-CM | POA: Diagnosis not present

## 2023-01-23 DIAGNOSIS — R490 Dysphonia: Secondary | ICD-10-CM | POA: Diagnosis not present

## 2023-01-23 DIAGNOSIS — R7301 Impaired fasting glucose: Secondary | ICD-10-CM | POA: Diagnosis not present

## 2023-01-23 DIAGNOSIS — M549 Dorsalgia, unspecified: Secondary | ICD-10-CM | POA: Diagnosis not present

## 2023-01-28 ENCOUNTER — Other Ambulatory Visit: Payer: Self-pay | Admitting: Endocrinology

## 2023-01-28 DIAGNOSIS — Z85828 Personal history of other malignant neoplasm of skin: Secondary | ICD-10-CM | POA: Diagnosis not present

## 2023-01-28 DIAGNOSIS — D692 Other nonthrombocytopenic purpura: Secondary | ICD-10-CM | POA: Diagnosis not present

## 2023-01-28 DIAGNOSIS — L570 Actinic keratosis: Secondary | ICD-10-CM | POA: Diagnosis not present

## 2023-01-28 DIAGNOSIS — L853 Xerosis cutis: Secondary | ICD-10-CM | POA: Diagnosis not present

## 2023-01-28 DIAGNOSIS — M5489 Other dorsalgia: Secondary | ICD-10-CM

## 2023-01-28 DIAGNOSIS — L821 Other seborrheic keratosis: Secondary | ICD-10-CM | POA: Diagnosis not present

## 2023-01-28 DIAGNOSIS — D1801 Hemangioma of skin and subcutaneous tissue: Secondary | ICD-10-CM | POA: Diagnosis not present

## 2023-01-28 DIAGNOSIS — L918 Other hypertrophic disorders of the skin: Secondary | ICD-10-CM | POA: Diagnosis not present

## 2023-01-28 DIAGNOSIS — D225 Melanocytic nevi of trunk: Secondary | ICD-10-CM | POA: Diagnosis not present

## 2023-01-31 ENCOUNTER — Ambulatory Visit
Admission: RE | Admit: 2023-01-31 | Discharge: 2023-01-31 | Disposition: A | Discharge status: Home / Self Care | Payer: 59 | Source: Ambulatory Visit | Attending: Endocrinology | Admitting: Endocrinology

## 2023-01-31 DIAGNOSIS — M5489 Other dorsalgia: Secondary | ICD-10-CM

## 2023-01-31 DIAGNOSIS — R0789 Other chest pain: Secondary | ICD-10-CM | POA: Diagnosis not present

## 2023-01-31 DIAGNOSIS — R918 Other nonspecific abnormal finding of lung field: Secondary | ICD-10-CM | POA: Diagnosis not present

## 2023-01-31 DIAGNOSIS — I700 Atherosclerosis of aorta: Secondary | ICD-10-CM | POA: Diagnosis not present

## 2023-01-31 DIAGNOSIS — I2510 Atherosclerotic heart disease of native coronary artery without angina pectoris: Secondary | ICD-10-CM | POA: Diagnosis not present

## 2023-01-31 DIAGNOSIS — K449 Diaphragmatic hernia without obstruction or gangrene: Secondary | ICD-10-CM | POA: Diagnosis not present

## 2023-03-06 DIAGNOSIS — R69 Illness, unspecified: Secondary | ICD-10-CM | POA: Diagnosis not present

## 2023-05-29 DIAGNOSIS — N401 Enlarged prostate with lower urinary tract symptoms: Secondary | ICD-10-CM | POA: Diagnosis not present

## 2023-05-29 DIAGNOSIS — E559 Vitamin D deficiency, unspecified: Secondary | ICD-10-CM | POA: Diagnosis not present

## 2023-05-29 DIAGNOSIS — R7301 Impaired fasting glucose: Secondary | ICD-10-CM | POA: Diagnosis not present

## 2023-05-29 DIAGNOSIS — E785 Hyperlipidemia, unspecified: Secondary | ICD-10-CM | POA: Diagnosis not present

## 2023-06-04 DIAGNOSIS — Z1339 Encounter for screening examination for other mental health and behavioral disorders: Secondary | ICD-10-CM | POA: Diagnosis not present

## 2023-06-04 DIAGNOSIS — Z23 Encounter for immunization: Secondary | ICD-10-CM | POA: Diagnosis not present

## 2023-06-04 DIAGNOSIS — I10 Essential (primary) hypertension: Secondary | ICD-10-CM | POA: Diagnosis not present

## 2023-06-04 DIAGNOSIS — Z0000 Encounter for general adult medical examination without abnormal findings: Secondary | ICD-10-CM | POA: Diagnosis not present

## 2023-06-04 DIAGNOSIS — Z1331 Encounter for screening for depression: Secondary | ICD-10-CM | POA: Diagnosis not present

## 2023-06-06 DIAGNOSIS — M4692 Unspecified inflammatory spondylopathy, cervical region: Secondary | ICD-10-CM | POA: Diagnosis not present

## 2023-06-06 DIAGNOSIS — M546 Pain in thoracic spine: Secondary | ICD-10-CM | POA: Diagnosis not present

## 2023-06-06 DIAGNOSIS — M791 Myalgia, unspecified site: Secondary | ICD-10-CM | POA: Diagnosis not present

## 2023-06-26 DIAGNOSIS — L282 Other prurigo: Secondary | ICD-10-CM | POA: Diagnosis not present

## 2023-06-26 DIAGNOSIS — I10 Essential (primary) hypertension: Secondary | ICD-10-CM | POA: Diagnosis not present

## 2023-07-18 DIAGNOSIS — K080 Exfoliation of teeth due to systemic causes: Secondary | ICD-10-CM | POA: Diagnosis not present

## 2023-09-17 DIAGNOSIS — I700 Atherosclerosis of aorta: Secondary | ICD-10-CM | POA: Diagnosis not present

## 2023-11-04 DIAGNOSIS — R051 Acute cough: Secondary | ICD-10-CM | POA: Diagnosis not present

## 2023-11-04 DIAGNOSIS — J209 Acute bronchitis, unspecified: Secondary | ICD-10-CM | POA: Diagnosis not present

## 2023-12-04 DIAGNOSIS — I10 Essential (primary) hypertension: Secondary | ICD-10-CM | POA: Diagnosis not present

## 2023-12-04 DIAGNOSIS — R7301 Impaired fasting glucose: Secondary | ICD-10-CM | POA: Diagnosis not present

## 2023-12-12 DIAGNOSIS — S0502XA Injury of conjunctiva and corneal abrasion without foreign body, left eye, initial encounter: Secondary | ICD-10-CM | POA: Diagnosis not present

## 2023-12-19 DIAGNOSIS — S0502XD Injury of conjunctiva and corneal abrasion without foreign body, left eye, subsequent encounter: Secondary | ICD-10-CM | POA: Diagnosis not present

## 2024-02-26 DIAGNOSIS — D2272 Melanocytic nevi of left lower limb, including hip: Secondary | ICD-10-CM | POA: Diagnosis not present

## 2024-02-26 DIAGNOSIS — L918 Other hypertrophic disorders of the skin: Secondary | ICD-10-CM | POA: Diagnosis not present

## 2024-02-26 DIAGNOSIS — D225 Melanocytic nevi of trunk: Secondary | ICD-10-CM | POA: Diagnosis not present

## 2024-02-26 DIAGNOSIS — Z85828 Personal history of other malignant neoplasm of skin: Secondary | ICD-10-CM | POA: Diagnosis not present

## 2024-03-24 DIAGNOSIS — H43813 Vitreous degeneration, bilateral: Secondary | ICD-10-CM | POA: Diagnosis not present
